# Patient Record
Sex: Male | Born: 1935 | Race: White | Hispanic: No | Smoking: Former smoker
Health system: Southern US, Community
[De-identification: ages and names within clinical notes are randomized; demographics above are authoritative.]

## PROBLEM LIST (undated history)

## (undated) DIAGNOSIS — M1990 Unspecified osteoarthritis, unspecified site: Secondary | ICD-10-CM

## (undated) DIAGNOSIS — G8929 Other chronic pain: Secondary | ICD-10-CM

## (undated) DIAGNOSIS — K219 Gastro-esophageal reflux disease without esophagitis: Secondary | ICD-10-CM

## (undated) DIAGNOSIS — I4891 Unspecified atrial fibrillation: Secondary | ICD-10-CM

## (undated) DIAGNOSIS — C801 Malignant (primary) neoplasm, unspecified: Secondary | ICD-10-CM

## (undated) DIAGNOSIS — I10 Essential (primary) hypertension: Secondary | ICD-10-CM

## (undated) DIAGNOSIS — E7800 Pure hypercholesterolemia, unspecified: Secondary | ICD-10-CM

## (undated) DIAGNOSIS — R200 Anesthesia of skin: Secondary | ICD-10-CM

## (undated) DIAGNOSIS — N189 Chronic kidney disease, unspecified: Secondary | ICD-10-CM

## (undated) DIAGNOSIS — Z974 Presence of external hearing-aid: Secondary | ICD-10-CM

## (undated) DIAGNOSIS — M353 Polymyalgia rheumatica: Secondary | ICD-10-CM

## (undated) DIAGNOSIS — M549 Dorsalgia, unspecified: Secondary | ICD-10-CM

## (undated) HISTORY — PX: HERNIA REPAIR: SHX51

---

## 1988-09-15 HISTORY — PX: GANGLION CYST EXCISION: SHX1691

## 1996-09-15 HISTORY — PX: CATARACT EXTRACTION: SUR2

## 2009-03-15 ENCOUNTER — Ambulatory Visit: Payer: Self-pay | Admitting: Family Medicine

## 2011-07-06 ENCOUNTER — Emergency Department: Payer: Self-pay | Admitting: *Deleted

## 2011-07-08 ENCOUNTER — Emergency Department: Payer: Self-pay | Admitting: Internal Medicine

## 2014-12-20 DIAGNOSIS — M150 Primary generalized (osteo)arthritis: Secondary | ICD-10-CM | POA: Diagnosis not present

## 2014-12-20 DIAGNOSIS — M353 Polymyalgia rheumatica: Secondary | ICD-10-CM | POA: Diagnosis not present

## 2014-12-20 DIAGNOSIS — E780 Pure hypercholesterolemia: Secondary | ICD-10-CM | POA: Diagnosis not present

## 2014-12-20 DIAGNOSIS — I10 Essential (primary) hypertension: Secondary | ICD-10-CM | POA: Diagnosis not present

## 2014-12-26 DIAGNOSIS — M150 Primary generalized (osteo)arthritis: Secondary | ICD-10-CM | POA: Diagnosis not present

## 2014-12-26 DIAGNOSIS — M5441 Lumbago with sciatica, right side: Secondary | ICD-10-CM | POA: Diagnosis not present

## 2014-12-26 DIAGNOSIS — E780 Pure hypercholesterolemia: Secondary | ICD-10-CM | POA: Diagnosis not present

## 2014-12-26 DIAGNOSIS — I10 Essential (primary) hypertension: Secondary | ICD-10-CM | POA: Diagnosis not present

## 2015-02-06 DIAGNOSIS — R079 Chest pain, unspecified: Secondary | ICD-10-CM | POA: Diagnosis not present

## 2015-02-06 DIAGNOSIS — I4891 Unspecified atrial fibrillation: Secondary | ICD-10-CM | POA: Diagnosis not present

## 2015-02-06 DIAGNOSIS — R0602 Shortness of breath: Secondary | ICD-10-CM | POA: Diagnosis not present

## 2015-02-07 DIAGNOSIS — I10 Essential (primary) hypertension: Secondary | ICD-10-CM | POA: Diagnosis not present

## 2015-02-07 DIAGNOSIS — E780 Pure hypercholesterolemia: Secondary | ICD-10-CM | POA: Diagnosis not present

## 2015-02-07 DIAGNOSIS — I4891 Unspecified atrial fibrillation: Secondary | ICD-10-CM | POA: Diagnosis not present

## 2015-02-07 DIAGNOSIS — M353 Polymyalgia rheumatica: Secondary | ICD-10-CM | POA: Diagnosis not present

## 2015-02-13 ENCOUNTER — Encounter: Payer: Self-pay | Admitting: *Deleted

## 2015-02-13 ENCOUNTER — Emergency Department: Payer: Commercial Managed Care - HMO

## 2015-02-13 ENCOUNTER — Inpatient Hospital Stay
Admission: EM | Admit: 2015-02-13 | Discharge: 2015-02-20 | DRG: 377 | Disposition: A | Discharge status: Home / Self Care | Payer: Commercial Managed Care - HMO | Attending: Internal Medicine | Admitting: Internal Medicine

## 2015-02-13 DIAGNOSIS — M353 Polymyalgia rheumatica: Secondary | ICD-10-CM | POA: Diagnosis present

## 2015-02-13 DIAGNOSIS — K7460 Unspecified cirrhosis of liver: Secondary | ICD-10-CM | POA: Diagnosis present

## 2015-02-13 DIAGNOSIS — M549 Dorsalgia, unspecified: Secondary | ICD-10-CM | POA: Diagnosis present

## 2015-02-13 DIAGNOSIS — R778 Other specified abnormalities of plasma proteins: Secondary | ICD-10-CM

## 2015-02-13 DIAGNOSIS — R0600 Dyspnea, unspecified: Secondary | ICD-10-CM | POA: Diagnosis present

## 2015-02-13 DIAGNOSIS — R7989 Other specified abnormal findings of blood chemistry: Secondary | ICD-10-CM

## 2015-02-13 DIAGNOSIS — I248 Other forms of acute ischemic heart disease: Secondary | ICD-10-CM | POA: Diagnosis not present

## 2015-02-13 DIAGNOSIS — N170 Acute kidney failure with tubular necrosis: Secondary | ICD-10-CM | POA: Diagnosis not present

## 2015-02-13 DIAGNOSIS — K2970 Gastritis, unspecified, without bleeding: Secondary | ICD-10-CM | POA: Diagnosis present

## 2015-02-13 DIAGNOSIS — E869 Volume depletion, unspecified: Secondary | ICD-10-CM | POA: Diagnosis present

## 2015-02-13 DIAGNOSIS — Z79899 Other long term (current) drug therapy: Secondary | ICD-10-CM | POA: Diagnosis not present

## 2015-02-13 DIAGNOSIS — R0602 Shortness of breath: Secondary | ICD-10-CM | POA: Diagnosis not present

## 2015-02-13 DIAGNOSIS — D500 Iron deficiency anemia secondary to blood loss (chronic): Secondary | ICD-10-CM | POA: Diagnosis not present

## 2015-02-13 DIAGNOSIS — I482 Chronic atrial fibrillation: Secondary | ICD-10-CM | POA: Diagnosis not present

## 2015-02-13 DIAGNOSIS — K922 Gastrointestinal hemorrhage, unspecified: Secondary | ICD-10-CM | POA: Diagnosis present

## 2015-02-13 DIAGNOSIS — I4891 Unspecified atrial fibrillation: Secondary | ICD-10-CM | POA: Diagnosis not present

## 2015-02-13 DIAGNOSIS — N183 Chronic kidney disease, stage 3 (moderate): Secondary | ICD-10-CM | POA: Diagnosis not present

## 2015-02-13 DIAGNOSIS — K766 Portal hypertension: Secondary | ICD-10-CM | POA: Diagnosis present

## 2015-02-13 DIAGNOSIS — D72829 Elevated white blood cell count, unspecified: Secondary | ICD-10-CM | POA: Diagnosis present

## 2015-02-13 DIAGNOSIS — K449 Diaphragmatic hernia without obstruction or gangrene: Secondary | ICD-10-CM | POA: Diagnosis not present

## 2015-02-13 DIAGNOSIS — Z87891 Personal history of nicotine dependence: Secondary | ICD-10-CM

## 2015-02-13 DIAGNOSIS — N179 Acute kidney failure, unspecified: Secondary | ICD-10-CM | POA: Diagnosis present

## 2015-02-13 DIAGNOSIS — Z7901 Long term (current) use of anticoagulants: Secondary | ICD-10-CM

## 2015-02-13 DIAGNOSIS — K222 Esophageal obstruction: Secondary | ICD-10-CM | POA: Diagnosis present

## 2015-02-13 DIAGNOSIS — N281 Cyst of kidney, acquired: Secondary | ICD-10-CM | POA: Diagnosis present

## 2015-02-13 DIAGNOSIS — K921 Melena: Principal | ICD-10-CM | POA: Diagnosis present

## 2015-02-13 DIAGNOSIS — D689 Coagulation defect, unspecified: Secondary | ICD-10-CM | POA: Diagnosis not present

## 2015-02-13 DIAGNOSIS — E785 Hyperlipidemia, unspecified: Secondary | ICD-10-CM | POA: Diagnosis present

## 2015-02-13 DIAGNOSIS — I129 Hypertensive chronic kidney disease with stage 1 through stage 4 chronic kidney disease, or unspecified chronic kidney disease: Secondary | ICD-10-CM | POA: Diagnosis present

## 2015-02-13 DIAGNOSIS — J449 Chronic obstructive pulmonary disease, unspecified: Secondary | ICD-10-CM | POA: Diagnosis not present

## 2015-02-13 DIAGNOSIS — R200 Anesthesia of skin: Secondary | ICD-10-CM | POA: Diagnosis present

## 2015-02-13 DIAGNOSIS — R740 Nonspecific elevation of levels of transaminase and lactic acid dehydrogenase [LDH]: Secondary | ICD-10-CM | POA: Diagnosis not present

## 2015-02-13 DIAGNOSIS — Z66 Do not resuscitate: Secondary | ICD-10-CM | POA: Diagnosis present

## 2015-02-13 DIAGNOSIS — K7200 Acute and subacute hepatic failure without coma: Secondary | ICD-10-CM | POA: Diagnosis not present

## 2015-02-13 DIAGNOSIS — D62 Acute posthemorrhagic anemia: Secondary | ICD-10-CM | POA: Diagnosis present

## 2015-02-13 DIAGNOSIS — R748 Abnormal levels of other serum enzymes: Secondary | ICD-10-CM

## 2015-02-13 DIAGNOSIS — I2510 Atherosclerotic heart disease of native coronary artery without angina pectoris: Secondary | ICD-10-CM | POA: Diagnosis not present

## 2015-02-13 DIAGNOSIS — M1990 Unspecified osteoarthritis, unspecified site: Secondary | ICD-10-CM | POA: Diagnosis present

## 2015-02-13 DIAGNOSIS — G8929 Other chronic pain: Secondary | ICD-10-CM | POA: Diagnosis present

## 2015-02-13 DIAGNOSIS — I480 Paroxysmal atrial fibrillation: Secondary | ICD-10-CM | POA: Diagnosis present

## 2015-02-13 DIAGNOSIS — R188 Other ascites: Secondary | ICD-10-CM | POA: Diagnosis not present

## 2015-02-13 DIAGNOSIS — D649 Anemia, unspecified: Secondary | ICD-10-CM | POA: Diagnosis not present

## 2015-02-13 DIAGNOSIS — R51 Headache: Secondary | ICD-10-CM | POA: Diagnosis not present

## 2015-02-13 DIAGNOSIS — R945 Abnormal results of liver function studies: Secondary | ICD-10-CM | POA: Diagnosis not present

## 2015-02-13 DIAGNOSIS — I10 Essential (primary) hypertension: Secondary | ICD-10-CM | POA: Diagnosis not present

## 2015-02-13 DIAGNOSIS — I350 Nonrheumatic aortic (valve) stenosis: Secondary | ICD-10-CM | POA: Diagnosis not present

## 2015-02-13 HISTORY — DX: Pure hypercholesterolemia, unspecified: E78.00

## 2015-02-13 HISTORY — DX: Essential (primary) hypertension: I10

## 2015-02-13 HISTORY — DX: Chronic kidney disease, unspecified: N18.9

## 2015-02-13 HISTORY — DX: Other chronic pain: G89.29

## 2015-02-13 HISTORY — DX: Dorsalgia, unspecified: M54.9

## 2015-02-13 HISTORY — DX: Unspecified atrial fibrillation: I48.91

## 2015-02-13 HISTORY — DX: Polymyalgia rheumatica: M35.3

## 2015-02-13 HISTORY — DX: Unspecified osteoarthritis, unspecified site: M19.90

## 2015-02-13 LAB — BASIC METABOLIC PANEL
Anion gap: 21 — ABNORMAL HIGH (ref 5–15)
BUN: 60 mg/dL — ABNORMAL HIGH (ref 6–20)
CALCIUM: 8.6 mg/dL — AB (ref 8.9–10.3)
CO2: 12 mmol/L — ABNORMAL LOW (ref 22–32)
Chloride: 102 mmol/L (ref 101–111)
Creatinine, Ser: 2.34 mg/dL — ABNORMAL HIGH (ref 0.61–1.24)
GFR calc Af Amer: 27 mL/min — ABNORMAL LOW (ref >60)
GFR calc non Af Amer: 23 mL/min — ABNORMAL LOW (ref >60)
GLUCOSE: 121 mg/dL — AB (ref 65–99)
POTASSIUM: 5.2 mmol/L — AB (ref 3.5–5.1)
Sodium: 135 mmol/L (ref 135–145)

## 2015-02-13 LAB — CBC
HCT: 20.9 % — ABNORMAL LOW (ref 40.0–52.0)
Hemoglobin: 6.6 g/dL — ABNORMAL LOW (ref 13.0–18.0)
MCH: 32 pg (ref 26.0–34.0)
MCHC: 31.6 g/dL — ABNORMAL LOW (ref 32.0–36.0)
MCV: 101 fL — ABNORMAL HIGH (ref 80.0–100.0)
PLATELETS: 269 10*3/uL (ref 150–440)
RBC: 2.07 MIL/uL — ABNORMAL LOW (ref 4.40–5.90)
RDW: 14.7 % — ABNORMAL HIGH (ref 11.5–14.5)
WBC: 20.5 10*3/uL — AB (ref 3.8–10.6)

## 2015-02-13 LAB — BRAIN NATRIURETIC PEPTIDE: B Natriuretic Peptide: 839 pg/mL — ABNORMAL HIGH (ref 0.0–100.0)

## 2015-02-13 LAB — TROPONIN I
TROPONIN I: 0.4 ng/mL — AB (ref <0.031)
TROPONIN I: 0.32 ng/mL — AB (ref <0.031)

## 2015-02-13 LAB — ABO/RH: ABO/RH(D): O POS

## 2015-02-13 LAB — PREPARE RBC (CROSSMATCH)

## 2015-02-13 MED ORDER — SODIUM CHLORIDE 0.9 % IV SOLN
INTRAVENOUS | Status: AC
  Administered 2015-02-13 – 2015-02-14 (×2): via INTRAVENOUS

## 2015-02-13 MED ORDER — SODIUM CHLORIDE 0.9 % IV SOLN
10.0000 mL/h | Freq: Once | INTRAVENOUS | Status: AC
  Administered 2015-02-13: 10 mL/h via INTRAVENOUS

## 2015-02-13 MED ORDER — ACETAMINOPHEN 325 MG PO TABS: 650.0000 mg | ORAL_TABLET | Freq: Four times a day (QID) | ORAL | Status: DC | PRN

## 2015-02-13 MED ORDER — ONDANSETRON HCL 4 MG PO TABS: 4.0000 mg | ORAL_TABLET | Freq: Four times a day (QID) | ORAL | Status: DC | PRN

## 2015-02-13 MED ORDER — SODIUM CHLORIDE 0.9 % IV SOLN
80.0000 mg | Freq: Once | INTRAVENOUS | Status: AC
  Administered 2015-02-14: 07:00:00 80 mg via INTRAVENOUS
  Filled 2015-02-13 (×2): qty 80

## 2015-02-13 MED ORDER — SODIUM CHLORIDE 0.9 % IJ SOLN
3.0000 mL | Freq: Two times a day (BID) | INTRAMUSCULAR | Status: DC
  Administered 2015-02-13 – 2015-02-20 (×13): 3 mL via INTRAVENOUS

## 2015-02-13 MED ORDER — METOPROLOL TARTRATE 25 MG PO TABS
25.0000 mg | ORAL_TABLET | Freq: Two times a day (BID) | ORAL | Status: DC
  Administered 2015-02-13 – 2015-02-20 (×13): 25 mg via ORAL
  Filled 2015-02-13 (×14): qty 1

## 2015-02-13 MED ORDER — ALBUTEROL SULFATE (2.5 MG/3ML) 0.083% IN NEBU: 2.5000 mg | INHALATION_SOLUTION | Freq: Four times a day (QID) | RESPIRATORY_TRACT | Status: DC | PRN

## 2015-02-13 MED ORDER — PRAVASTATIN SODIUM 20 MG PO TABS
20.0000 mg | ORAL_TABLET | Freq: Every day | ORAL | Status: DC
  Administered 2015-02-14 – 2015-02-15 (×2): 20 mg via ORAL
  Filled 2015-02-13 (×2): qty 1

## 2015-02-13 MED ORDER — DILTIAZEM HCL ER COATED BEADS 240 MG PO CP24
240.0000 mg | ORAL_CAPSULE | Freq: Every day | ORAL | Status: DC
  Administered 2015-02-14 – 2015-02-20 (×7): 240 mg via ORAL
  Filled 2015-02-13 (×8): qty 1

## 2015-02-13 MED ORDER — SODIUM CHLORIDE 0.9 % IV SOLN
8.0000 mg/h | INTRAVENOUS | Status: AC
  Administered 2015-02-14 – 2015-02-16 (×5): 8 mg/h via INTRAVENOUS
  Filled 2015-02-13 (×5): qty 80

## 2015-02-13 MED ORDER — ACETAMINOPHEN 650 MG RE SUPP: 650.0000 mg | Freq: Four times a day (QID) | RECTAL | Status: DC | PRN

## 2015-02-13 MED ORDER — ONDANSETRON HCL 4 MG/2ML IJ SOLN: 4.0000 mg | Freq: Four times a day (QID) | INTRAMUSCULAR | Status: DC | PRN

## 2015-02-13 NOTE — ED Notes (Signed)
Lab called at [facility] to notify rn that the blood is ready, receiving rn on the floor made aware of the same

## 2015-02-13 NOTE — ED Notes (Signed)
Pt arrives via EMS. Pt reports increasing shortness of breath, especially with ambulation for several weeks. Denies fever or cough.

## 2015-02-13 NOTE — H&P (Signed)
William Tapia at [place] NAME: William Tapia    MR#:  [id number]  DATE OF BIRTH:  09-16-1924  DATE OF ADMISSION:  02/13/2015  PRIMARY CARE PHYSICIAN: Tracie Harrier, MD   REQUESTING/REFERRING PHYSICIAN: Dr. Thomasene Lot  CHIEF COMPLAINT:   Chief Complaint  Patient presents with  . Shortness of Breath    HISTORY OF PRESENT ILLNESS:  HPI William Tapia is a 81 y.o. male with Afib recently started on Xarelto, HTN, Chronic back pain who is been having worsening shortness of breath over the past 6 days. He reports this shortness of breath is worse when he is up and walking. He is not able to walk nearly the distance that he was previously. He denies any shortness of breath while sleeping or at night (no orthopnea). He has seen his primary physician, Dr. Ginette Pitman day recently as well as Dr. Ubaldo Glassing. Dr. Ubaldo Glassing has ordered an echocardiogram on him for 8 days from now.  In addition to the dyspnea on exertion, the patient reports he is having an odd feeling of numbness in the tip of his tongue.  Patient denies any chest pain. He does report he has a mild posterior headache at the moment.  Started on Xarelto 6 days back and he started noticing black stool immediately the next day. NO frank blood.  PAST MEDICAL HISTORY:   Past Medical History  Diagnosis Date  . A-fib   . High cholesterol   . PMR (polymyalgia rheumatica)   . Chronic back pain   . Osteoarthritis   . Hypertension     PAST SURGICAL HISTORY:   Past Surgical History  Procedure Laterality Date  . Hernia repair      SOCIAL HISTORY:   History  Substance Use Topics  . Smoking status: Former Smoker    Types: Cigarettes  . Smokeless tobacco: Not on file  . Alcohol Use: No     Comment: 3 beers a day for many years and quit 02/06/2015    FAMILY HISTORY:   Family History  Problem Relation Age of Onset  . Family history unknown: Yes    DRUG ALLERGIES:  No Known  Allergies  REVIEW OF SYSTEMS:   Review of Systems  Constitutional: Positive for malaise/fatigue. Negative for fever, chills and weight loss.  HENT: Negative for hearing loss and nosebleeds.   Eyes: Negative for blurred vision, double vision and pain.  Respiratory: Positive for shortness of breath. Negative for cough, hemoptysis, sputum production and wheezing.   Cardiovascular: Negative for chest pain, palpitations, orthopnea and leg swelling.  Gastrointestinal: Positive for melena. Negative for nausea, vomiting, abdominal pain, diarrhea and constipation.  Genitourinary: Negative for dysuria and hematuria.  Musculoskeletal: Negative for myalgias, back pain and falls.  Skin: Negative for rash.  Neurological: Negative for dizziness, tremors, sensory change, speech change, focal weakness, seizures and headaches.  Endo/Heme/Allergies: Does not bruise/bleed easily.  Psychiatric/Behavioral: Negative for depression and memory loss. The patient is not nervous/anxious.     MEDICATIONS AT HOME:   Prior to Admission medications   Medication Sig Start Date End Date Taking? Authorizing Provider  CARTIA XT 240 MG 24 hr capsule Take 1 capsule by mouth daily. 11/29/14  Yes Historical Provider, MD  Coenzyme Q10 (COQ-10 PO) Take 1 tablet by mouth daily.   Yes Historical Provider, MD  lovastatin (MEVACOR) 20 MG tablet Take 20 mg by mouth daily.   Yes Historical Provider, MD  metoprolol tartrate (LOPRESSOR) 25 MG tablet Take 25 mg  by mouth 2 (two) times daily.   Yes Historical Provider, MD  rivaroxaban (XARELTO) 20 MG TABS tablet Take 20 mg by mouth daily.   Yes Historical Provider, MD  VENTOLIN HFA 108 (90 BASE) MCG/ACT inhaler Take 2 puffs by mouth every 6 (six) hours as needed. For wheezing 12/26/14  Yes Historical Provider, MD      VITAL SIGNS:  Blood pressure 120/59, pulse 60, temperature 97.2 F (36.2 C), resp. rate 17, height 5\' 6"  (1.676 m), weight 63.957 kg (141 lb), SpO2 100 %.  PHYSICAL  EXAMINATION:  Physical Exam  GENERAL:  80 y.o.-year-old patient lying in the bed with no acute distress.  EYES: Pupils equal, round, reactive to light and accommodation. No scleral icterus. Extraocular muscles intact.  HEENT: Head atraumatic, normocephalic. Oropharynx and nasopharynx clear. No oropharyngeal erythema, moist oral mucosa  NECK:  Supple, no jugular venous distention. No thyroid enlargement, no tenderness.  LUNGS: Normal breath sounds bilaterally, no wheezing, rales, rhonchi. No use of accessory muscles of respiration.  CARDIOVASCULAR: S1, S2 normal. No murmurs, rubs, or gallops. Irregular. ABDOMEN: Soft, nontender, nondistended. Bowel sounds present. No organomegaly or mass.  EXTREMITIES: No pedal edema, cyanosis, or clubbing. + 2 pedal & radial pulses b/l.   NEUROLOGIC: Cranial nerves II through XII are intact. No focal Motor or sensory deficits appreciated b/l PSYCHIATRIC: The patient is alert and oriented x 3. Good affect.  SKIN: No obvious rash, lesion, or ulcer.   LABORATORY PANEL:   CBC  Recent Labs Lab 02/13/15 1617  WBC 20.5*  HGB 6.6*  HCT 20.9*  PLT 269   ------------------------------------------------------------------------------------------------------------------  Chemistries   Recent Labs Lab 02/13/15 1617  NA 135  K 5.2*  CL 102  CO2 12*  GLUCOSE 121*  BUN 60*  CREATININE 2.34*  CALCIUM 8.6*   ------------------------------------------------------------------------------------------------------------------  Cardiac Enzymes  Recent Labs Lab 02/13/15 1617  TROPONINI 0.40*   ------------------------------------------------------------------------------------------------------------------  RADIOLOGY:  Dg Chest 2 View  02/13/2015   CLINICAL DATA:  Increasing shortness of Breath  EXAM: CHEST  2 VIEW  COMPARISON:  None.  FINDINGS: Cardiac shadow is enlarged. The lungs are hyperinflated bilaterally consistent with COPD. No focal infiltrate  or sizable effusion is noted. Degenerative change of the thoracic spine is seen. Mild aortic calcifications are noted.  IMPRESSION: COPD without acute abnormality.   Electronically Signed   By: Inez Catalina M.D.   On: 02/13/2015 16:57   Ct Head Wo Contrast  02/13/2015   CLINICAL DATA:  Headaches  EXAM: CT HEAD WITHOUT CONTRAST  TECHNIQUE: Contiguous axial images were obtained from the base of the skull through the vertex without intravenous contrast.  COMPARISON:  None.  FINDINGS: Bony calvarium is intact. Mild atrophic changes are noted. No findings to suggest acute hemorrhage, acute infarction or space-occupying mass lesion are noted.  IMPRESSION: Atrophic changes without acute abnormality.   Electronically Signed   By: Inez Catalina M.D.   On: 02/13/2015 17:10     IMPRESSION AND PLAN:   * GI bleed- Melena Prolonged history of daily alcohol intake. Possibly has gastritis or undiagnosed peptic ulcer disease. Xarelto could have initiated the bleeding. Hold Xarelto. Serial hemoglobins. Stat transfusion of 2 units of packed RBC. Protonix drip. Consult GI.  * Acute blood loss anemia Due to GI bleed.  * ARF likely from GI bleed/hypotension Start IV fluids. Replace volume depletion with blood and fluids. Monitor input and output. Repeat BUN and creatinine in the morning.  * Elevated troponin 0.4. Likely from demand ischemia  of severe anemia and atrial fibrillation in setting of worsening kidney function. No chest pain. Repeat troponin. Not NSTEMI. We will consult cardiology if there is significant increase in troponin. No aspirin due to GI bleed.  * Afib Rate controlled. Hold Xarelto.  * Hypertension   All the records are reviewed and case discussed with ED provider. Management plans discussed with the patient, family and they are in agreement.  CODE STATUS: DNR/DNI  TOTAL TIME TAKING CARE OF THIS PATIENT: 40 minutes.    Hillary Bow R M.D on 02/13/2015 at 6:52 PM  Between 7am to 6pm  - Pager - [phone]  After 6pm go to [email or web address] - password [email or web address] [facility]  Office  [phone]  CC: Primary care physician; Tracie Harrier, MD

## 2015-02-13 NOTE — ED Provider Notes (Signed)
[facility] Emergency Department Provider Note  ____________________________________________  Time seen: 1558  I have reviewed the triage vital signs and the nursing notes.   HISTORY  Chief Complaint Shortness of Breath  dyspnea on exertion     HPI William Tapia is a 81 y.o. male who is been having worsening shortness of breath over the past 2 weeks. He reports this shortness of breath is worse when he is up and walking. He is not able to walk nearly the distance that he was previously. He denies any shortness of breath while sleeping or at night (no orthopnea). He has seen his primary physician, Dr. Ginette Pitman day recently as well as Dr. Ubaldo Glassing. Dr. Ubaldo Glassing has ordered an echocardiogram on him for 8 days from now.  In addition to the dyspnea on exertion, the patient reports he is having an odd feeling of numbness in the tip of his tongue. This is also leading to some impaired speech. His wife, who is in the room with [place], reports that the mild lisp he has is new. The patient also reports he is having some difficulty swallowing.  Patient denies any chest pain. He does report he has a mild posterior headache at the moment.   Past Medical History  Diagnosis Date  . A-fib   . High cholesterol   . PMR (polymyalgia rheumatica)     There are no active problems to display for this patient.   Past Surgical History  Procedure Laterality Date  . Hernia repair      Current Outpatient Rx  Name  Route  Sig  Dispense  Refill  . CARTIA XT 240 MG 24 hr capsule   Oral   Take 1 capsule by mouth daily.           Dispense as written.   . Coenzyme Q10 (COQ-10 PO)   Oral   Take 1 tablet by mouth daily.         Marland Kitchen lovastatin (MEVACOR) 20 MG tablet   Oral   Take 20 mg by mouth daily.         . metoprolol tartrate (LOPRESSOR) 25 MG tablet   Oral   Take 25 mg by mouth 2 (two) times daily.         . rivaroxaban (XARELTO) 20 MG TABS tablet   Oral   Take 20 mg by  mouth daily.         . VENTOLIN HFA 108 (90 BASE) MCG/ACT inhaler   Oral   Take 2 puffs by mouth every 6 (six) hours as needed. For wheezing      5     Dispense as written.     Allergies Review of patient's allergies indicates no known allergies.  No family history on file.  Social History History  Substance Use Topics  . Smoking status: Never Smoker   . Smokeless tobacco: Not on file  . Alcohol Use: No    Review of Systems  Constitutional: Negative for fever. ENT: Negative for sore throat. Cardiovascular: Negative for chest pain. History of atrial fibrillation Respiratory: Positive for shortness of breath. Gastrointestinal: Negative for abdominal pain, vomiting and diarrhea. Genitourinary: Negative for dysuria. Musculoskeletal: Negative for back pain. Skin: Negative for rash. Neurological: Positive for headache as well as for the past seizure was mild speech impediment. See history of present illness   10-point ROS otherwise negative.  ____________________________________________   PHYSICAL EXAM:  VITAL SIGNS: ED Triage Vitals  Enc Vitals Group  BP --      Pulse --      Resp --      Temp --      Temp src --      SpO2 --      Weight --      Height --      Head Cir --      Peak Flow --      Pain Score --      Pain Loc --      Pain Edu? --      Excl. in [place]? --     Constitutional:  Alert and oriented. Mildly tachypnea, mild increased work of breathing, but no acute distress. ENT   Head: Normocephalic and atraumatic.   Nose: No congestion/rhinnorhea.   Mouth/Throat: Mucous membranes are moist. Cardiovascular: Rate of 60 with atrial fibrillation, irregularly irregular. Respiratory: Mild tachypnea, mild increase work of breathing, otherwise no acute distress. No wheezing or rales.. Gastrointestinal: Soft and nontender. No distention.  Back: No muscle spasm, no tenderness, no CVA tenderness. Musculoskeletal: Nontender with normal range  of motion in all extremities.  No noted edema. Neurologic: Alert and oriented. Moves all 4 extremities without difficulty. Equal grip strength bilaterally. Cranial nerves II through XII are intact. The tongue appears to be moving fairly well except for the noted lisp. Skin:  Skin is warm, dry. No rash noted. Psychiatric: Mood and affect are normal. Speech and behavior are normal.   ----------------------------------------- 5:38 PM on 02/13/2015 -----------------------------------------  Rectal exam: Nontender, dark stool, heme positive. ____________________________________________    LABS (pertinent positives/negatives)  White blood cell count of 20,000, hemoglobin of 6.6. Potassium of 5.2. BUN of 60 creatinine of 2.34. Troponin elevated at 0.40 BNP of 839.  ____________________________________________   EKG  ED ECG REPORT I, Sheppard Luckenbach W, the attending physician, personally viewed and interpreted this ECG.   Date: 02/13/2015  EKG Time: 1608  Rate: 64  Rhythm: Atrial fibrillation Normal sinus rhythm  Axis: 27,  Normal  Intervals: QTc 524  ST&T Change: Flat T in aVL and flipped T in V6   ____________________________________________    RADIOLOGY  Chest x-ray  IMPRESSION: COPD without acute abnormality.  CT head IMPRESSION: Atrophic changes without acute abnormality.  ____________________________________________   PROCEDURES  Critical Care performed:  CRITICAL CARE Performed by: Ahmed Prima   Total critical care time: 40 minutes due to the critical condition of this patient with leukocytosis, anemia, and acute renal failure. This included orders for resuscitation as well as discussion with the hospitalist for admission and with the family.  Critical care time was exclusive of separately billable procedures and treating other patients.  Critical care was necessary to treat or prevent imminent or life-threatening deterioration.  Critical care  was time spent personally by me on the following activities: development of treatment plan with patient and/or surrogate as well as nursing, discussions with consultants, evaluation of patient's response to treatment, examination of patient, obtaining history from patient or surrogate, ordering and performing treatments and interventions, ordering and review of laboratory studies, ordering and review of radiographic studies, pulse oximetry and re-evaluation of patient's condition.   ____________________________________________   INITIAL IMPRESSION / ASSESSMENT AND PLAN / ED COURSE  Patient without a history of coronary disease or pulmonary disease with increased shortness of breath, dyspnea on exertion. Also concern is this paresthesia in his tongue and some difficulty swallowing and this mild speech impediment which is new. Given the fact that he has the symptoms in context  of a posterior headache I will obtain a head CT on this 81 year old gentleman. We will continue with a cardiac and respiratory evaluation.   ----------------------------------------- 5:39 PM on 02/13/2015 -----------------------------------------  Patient with anemia. He has heme positive stool on rectal exam. The patient now tells me that he has had symptoms since beginning Xarelto recently. His BUN is elevated consistent with a GI bleed. Of note, when performing the rectal exam and having the patient move in bed and roll over he became slightly short of breath.  We will speak with the admitting team for admission and ongoing care in the hospital. ____________________________________________   FINAL CLINICAL IMPRESSION(S) / ED DIAGNOSES  Final diagnoses:  Anemia, unspecified anemia type  Acute renal failure, unspecified acute renal failure type  Elevated troponin level  Dyspnea  Gastrointestinal hemorrhage, unspecified gastritis, unspecified gastrointestinal hemorrhage type      Ahmed Prima, MD 02/13/15  [phone]

## 2015-02-14 ENCOUNTER — Encounter: Payer: Self-pay | Admitting: Urgent Care

## 2015-02-14 DIAGNOSIS — K7200 Acute and subacute hepatic failure without coma: Secondary | ICD-10-CM

## 2015-02-14 DIAGNOSIS — K921 Melena: Secondary | ICD-10-CM

## 2015-02-14 DIAGNOSIS — D649 Anemia, unspecified: Secondary | ICD-10-CM

## 2015-02-14 DIAGNOSIS — K922 Gastrointestinal hemorrhage, unspecified: Secondary | ICD-10-CM

## 2015-02-14 LAB — CBC
HEMATOCRIT: 24.9 % — AB (ref 40.0–52.0)
HEMOGLOBIN: 8.1 g/dL — AB (ref 13.0–18.0)
MCH: 31 pg (ref 26.0–34.0)
MCHC: 32.3 g/dL (ref 32.0–36.0)
MCV: 95.8 fL (ref 80.0–100.0)
Platelets: 215 10*3/uL (ref 150–440)
RBC: 2.6 MIL/uL — AB (ref 4.40–5.90)
RDW: 14.9 % — ABNORMAL HIGH (ref 11.5–14.5)
WBC: 20.6 10*3/uL — ABNORMAL HIGH (ref 3.8–10.6)

## 2015-02-14 LAB — BASIC METABOLIC PANEL
Anion gap: 11 (ref 5–15)
BUN: 76 mg/dL — AB (ref 6–20)
CHLORIDE: 104 mmol/L (ref 101–111)
CO2: 18 mmol/L — ABNORMAL LOW (ref 22–32)
CREATININE: 2.64 mg/dL — AB (ref 0.61–1.24)
Calcium: 8 mg/dL — ABNORMAL LOW (ref 8.9–10.3)
GFR calc Af Amer: 23 mL/min — ABNORMAL LOW (ref >60)
GFR calc non Af Amer: 20 mL/min — ABNORMAL LOW (ref >60)
GLUCOSE: 110 mg/dL — AB (ref 65–99)
Potassium: 4.8 mmol/L (ref 3.5–5.1)
Sodium: 133 mmol/L — ABNORMAL LOW (ref 135–145)

## 2015-02-14 LAB — TROPONIN I
TROPONIN I: 0.39 ng/mL — AB (ref <0.031)
Troponin I: 0.42 ng/mL — ABNORMAL HIGH (ref <0.031)

## 2015-02-14 MED ORDER — SODIUM CHLORIDE 0.9 % IV SOLN
INTRAVENOUS | Status: AC
Start: 2015-02-14 — End: 2015-02-15
  Administered 2015-02-14 – 2015-02-15 (×2): via INTRAVENOUS

## 2015-02-14 NOTE — Progress Notes (Signed)
Initial Nutrition Assessment  DOCUMENTATION CODES:     INTERVENTION: Medical Food Supplement Therapy: will recommend Vanilla Ensure Enlive po BID, each supplement provides 350 kcal and 20 grams of protein once diet able to be advanced    NUTRITION DIAGNOSIS:  Inadequate oral intake related to inability to eat as evidenced by NPO status.  GOAL:   (Goal for diet advancement as medically able within 5-7 days)  MONITOR:   (Energy Intake, Digestive system, Anemia Profile, Anthropometrics, Electrolyte and renal Profile)  REASON FOR ASSESSMENT:  Malnutrition Screening Tool    ASSESSMENT:   Present on Admission:  . GI bleed GI consult pending. Pt s/p blood transfusion.  Pt also noted to have acute renal failure per MD note.  Past Medical History  Diagnosis Date  . A-fib   . High cholesterol   . PMR (polymyalgia rheumatica)   . Chronic back pain   . Osteoarthritis   . Hypertension    PO Intake: pt remains NPO since admission. Pt reports day before admission drinking 3 Ensures secondary to poor appetite. Pt reports appetite reduced PTA. Pt reports usually eating 3 meals per day.  Medications: NS at 2mL/hr, Protonix Labs: Electrolyte and Renal Profile:  Recent Labs Lab 02/13/15 1617 02/14/15 0735  BUN 60* 76*  CREATININE 2.34* 2.64*  NA 135 133*  K 5.2* 4.8   Glucose Profile: No results for input(s): GLUCAP in the last 72 hours. Protein Profile: No results for input(s): ALBUMIN in the last 168 hours. Nutritional Anemia Profile:  CBC Latest Ref Rng 02/14/2015 02/13/2015  WBC 3.8 - 10.6 K/uL 20.6(H) 20.5(H)  Hemoglobin 13.0 - 18.0 g/dL 8.1(L) 6.6(L)  Hematocrit 40.0 - 52.0 % 24.9(L) 20.9(L)  Platelets 150 - 440 K/uL 215 269   Pt reports weight of 141lbs outpatient MD office on 5/24, usual body weight of 145lbs. (3% weight loss reported from [facility]) Pt weight currently recorded as 150lbs this am.  Height:  Ht Readings from Last 1 Encounters:  02/13/15 5\' 6"  (1.676  m)    Weight:  Wt Readings from Last 1 Encounters:  02/14/15 150 lb 3.2 oz (68.13 kg)    Ideal Body Weight:     Wt Readings from Last 10 Encounters:  02/14/15 150 lb 3.2 oz (68.13 kg)    BMI:  Body mass index is 24.25 kg/(m^2).   Nutrition-Focused physical exam completed. Findings are WDL for fat depletion, muscle depletion, and edema.    Estimated Nutritional Needs:  Kcal:  1709-2109kcals, BEE: 1294kcals, TEE: (IF 1.1-1.3)(AF 1.2)   Protein:  68-82g protein (1.0-1.2g/kg)  Fluid:  1705-2037mL of fluid (25-68mL/kg)  Skin:  Reviewed, no issues  Diet Order:  Diet NPO time specified  EDUCATION NEEDS:  No education needs identified at this time   Intake/Output Summary (Last 24 hours) at 02/14/15 1206 Last data filed at 02/14/15 0641  Gross per 24 hour  Intake    636 ml  Output    200 ml  Net    436 ml    Last BM:  5/31  MODERATE Care Level  Dwyane Luo, RD, LDN Pager [phone]

## 2015-02-14 NOTE — Plan of Care (Signed)
Problem: Discharge Progression Outcomes Goal: Discharge plan in place and appropriate Individualization Outcome: Progressing Individualization of Care 1. Pt likes to be called William Tapia 2. Pt lives at home with his wife 3. Pt has a hx of afib, HTN, chronic back pain, PMR, osteoarthritis, and high cholesterol controlled by home meds.  Goal: Other Discharge Outcomes/Goals Outcome: Progressing Plan of care progress to goal for: Pain-no c/o pain this shift Hemodynamically-VSS, pt received two units pRBC with no reaction Complications-pt gets SOB on excertion Diet-pt is NPO Activity-Pt has good mobility in bed

## 2015-02-14 NOTE — Progress Notes (Signed)
Spoke with Dr Lavetta Nielsen regarding metoprolol administration. Pt's HR 60-62, HR was once 116 during day shift, but sinus bradycardia previously. BP 118/51. Do you want to hold metoprolol tonight? MD verbalized to hold metorolol tonight.

## 2015-02-14 NOTE — Progress Notes (Signed)
Notified Dr. Ola Spurr that pt IVF order has been stopped per order. BP/HR stable. Pt is on clear liquid and NPO after midnight. Do you want to continue IVF? MD verbalized to continue IVF.

## 2015-02-14 NOTE — Progress Notes (Signed)
PROGRESS NOTE  William Tapia GYI:[id number] DOB: 03-18-1925 DOA: 02/13/2015 PCP: William Harrier, MD  HPI/Recap of past 24 hours: 81  Y/o male with hx of HTN, Recent onset A.Fib, Chronic back pain admitted with exertional shortness of breath, Melanotic stool . Hgb was 6.6 on admission Had been recently started on Xarelto for A-Fib Pt received 2 units of PRBC over night. This am feels better   Consultants: GI   Objective: BP 109/55 mmHg  Pulse 52  Temp(Src) 97.8 F (36.6 C) (Oral)  Resp 18  Ht 5\' 6"  (1.676 m)  Wt 68.13 kg (150 lb 3.2 oz)  BMI 24.25 kg/m2  SpO2 95%  Intake/Output Summary (Last 24 hours) at 02/14/15 0812 Last data filed at 02/14/15 0641  Gross per 24 hour  Intake    636 ml  Output    200 ml  Net    436 ml   Filed Weights   02/13/15 1556 02/13/15 1950 02/14/15 0500  Weight: 63.957 kg (141 lb) 59.693 kg (131 lb 9.6 oz) 68.13 kg (150 lb 3.2 oz)    Exam:   General:  Not in sdistress  Cardiovascular: S1 S2  Respiratory: Clear to auscultation  Abdomen: Soft. Non tender  Neuro:Non Focal  Data Reviewed: Basic Metabolic Panel:  Recent Labs Lab 02/13/15 1617  NA 135  K 5.2*  CL 102  CO2 12*  GLUCOSE 121*  BUN 60*  CREATININE 2.34*  CALCIUM 8.6*   Liver Function Tests: No results for input(s): AST, ALT, ALKPHOS, BILITOT, PROT, ALBUMIN in the last 168 hours. No results for input(s): LIPASE, AMYLASE in the last 168 hours. No results for input(s): AMMONIA in the last 168 hours. CBC:  Recent Labs Lab 02/13/15 1617 02/14/15 0735  WBC 20.5* 20.6*  HGB 6.6* 8.1*  HCT 20.9* 24.9*  MCV 101.0* 95.8  PLT 269 215   Cardiac Enzymes:    Recent Labs Lab 02/13/15 1617 02/13/15 1937 02/14/15 0107  TROPONINI 0.40* 0.32* 0.42*   BNP (last 3 results)  Recent Labs  02/13/15 1617  BNP 839.0*    ProBNP (last 3 results) No results for input(s): PROBNP in the last 8760 hours.  CBG: No results for input(s): GLUCAP in the last 168  hours.  No results found for this or any previous visit (from the past 240 hour(s)).   Studies: Dg Chest 2 View  02/13/2015   CLINICAL DATA:  Increasing shortness of Breath  EXAM: CHEST  2 VIEW  COMPARISON:  None.  FINDINGS: Cardiac shadow is enlarged. The lungs are hyperinflated bilaterally consistent with COPD. No focal infiltrate or sizable effusion is noted. Degenerative change of the thoracic spine is seen. Mild aortic calcifications are noted.  IMPRESSION: COPD without acute abnormality.   Electronically Signed   By: William Tapia M.D.   On: 02/13/2015 16:57   Ct Head Wo Contrast  02/13/2015   CLINICAL DATA:  Headaches  EXAM: CT HEAD WITHOUT CONTRAST  TECHNIQUE: Contiguous axial images were obtained from the base of the skull through the vertex without intravenous contrast.  COMPARISON:  None.  FINDINGS: Bony calvarium is intact. Mild atrophic changes are noted. No findings to suggest acute hemorrhage, acute infarction or space-occupying mass lesion are noted.  IMPRESSION: Atrophic changes without acute abnormality.   Electronically Signed   By: William Tapia M.D.   On: 02/13/2015 17:10    Scheduled Meds: . diltiazem  240 mg Oral Daily  . metoprolol tartrate  25 mg Oral BID  . pravastatin  20 mg Oral q1800  . sodium chloride  3 mL Intravenous Q12H    Continuous Infusions: . sodium chloride 75 mL/hr at 02/13/15 2049  . pantoprozole (PROTONIX) infusion 8 mg/hr (02/14/15 0736)    Assessment/Plan: 1 Acute GI bleed with blood loss anemia; Most likely  Upper GI source; Hgb improved to 8.1 following transfusion. Continue IV Protonix D/c Xarelto Consult GI 2 Recent onset - A- Fib-On Cardizem and metoprolol 3 Acute Renal Failure; Continue IV hydration. 4 Elevated Troponin; Probable demand ischemia. 5 Hyperlipidemia; On Pravachol 6 Physical Therapy   Code Status: DNR/DNI        William Tapia   02/14/2015, 8:12 AM  LOS: 1 day

## 2015-02-14 NOTE — Care Management (Signed)
Admitted to  [facility] with the diagnosis of GI Bleed. Lives with wife, Otilio Saber, [phone]). Sees Dr Ginette Pitman. Last seen in April. No home health. No skilled facility. States he has a stick in the home that he uses some time to steady himself when walking. No home oxygen. Takes care of all basic activities of daily living himself. Hgb 6.6. One unit of blood. NPO. GI consult. Family at the bedside. Shelbie Ammons RN MSN Care Management [phone]

## 2015-02-14 NOTE — Plan of Care (Signed)
Problem: Discharge Progression Outcomes Goal: Other Discharge Outcomes/Goals Plan of care progress to goal: Pain: denies pain VSS Diet: Clear liquids, tolerating well EGD tomorrow

## 2015-02-14 NOTE — Consult Note (Signed)
Gastroenterology Consultation  Referring Provider:    Dr. Ginette Pitman  Primary Care Physician:  Tracie Harrier, MD Primary Gastroenterologist:  n/a     Cardiologist:  Dr Ubaldo Glassing  Reason for Consultation:  Melena  Date of Admission:  02/13/2015 Date of Consultation:  02/14/2015         HPI:   William Tapia is a 81 y.o. male admitted with worsening shortness of breath over the past 6 days. He was recently started on Xarelto for atrial fibrillation 7 days ago and he started noticing melena the following day.  He took 1 more dose.  Denies heartburn, indigestion, nausea, vomiting, dysphagia, odynophagia.  Denies constipation or diarrhea.  Appetite has been poor.  He reports weight loss of a few pounds.  Last colonoscopy was almost 35 years ago per his report.  He used to drink 2-4 beers a night for many years, but he quit 5/24.    Past Medical History  Diagnosis Date  . A-fib   . High cholesterol   . PMR (polymyalgia rheumatica)   . Chronic back pain   . Osteoarthritis   . Hypertension     Past Surgical History  Procedure Laterality Date  . Hernia repair Right   . Ganglion cyst excision      foot  . Cataract extraction      Prior to Admission medications   Medication Sig Start Date End Date Taking? Authorizing Provider  CARTIA XT 240 MG 24 hr capsule Take 1 capsule by mouth daily. 11/29/14  Yes Historical Provider, MD  Coenzyme Q10 (COQ-10 PO) Take 1 tablet by mouth daily.   Yes Historical Provider, MD  cyanocobalamin 1000 MCG tablet Take 1,000 mcg by mouth daily.   Yes Historical Provider, MD  lovastatin (MEVACOR) 20 MG tablet Take 20 mg by mouth daily.   Yes Historical Provider, MD  metoprolol tartrate (LOPRESSOR) 25 MG tablet Take 25 mg by mouth 2 (two) times daily.   Yes Historical Provider, MD  rivaroxaban (XARELTO) 20 MG TABS tablet Take 20 mg by mouth daily.   Yes Historical Provider, MD  VENTOLIN HFA 108 (90 BASE) MCG/ACT inhaler Take 2 puffs by mouth every 6 (six) hours as  needed. For wheezing 12/26/14  Yes Historical Provider, MD    Family History  Problem Relation Age of Onset  . Family history unknown: Yes   There is no known family history of colorectal carcinoma , liver disease, or inflammatory bowel disease.  History  Substance Use Topics  . Smoking status: Former Smoker -- 35 years    Types: Cigarettes    Quit date: 02/13/2010  . Smokeless tobacco: Not on file  . 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 One healthy son.  Allergies as of 02/13/2015  . (No Known Allergies)    Review of Systems:    All systems reviewed and negative except where noted in HPI.   Physical Exam:  Vital signs in last 24 hours: Temp:  [97.2 F (36.2 C)-98.5 F (36.9 C)] 98.3 F (36.8 C) (06/01 1403) Pulse Rate:  [52-116] 116 (06/01 1403) Resp:  [17-22] 18 (06/01 0521) BP: (104-128)/(44-99) 104/54 mmHg (06/01 1403) SpO2:  [87 %-100 %] 91 % (06/01 1403) Weight:  [59.693 kg (131 lb 9.6 oz)-68.13 kg (150 lb 3.2 oz)] 68.13 kg (150 lb 3.2 oz) (06/01 0500) Last  BM Date: 02/13/15 General:   Alert,  Well-developed, well-nourished, pleasant and cooperative in NAD Head:  Normocephalic and atraumatic. Eyes:  Sclera clear, no icterus.   Conjunctiva pink. Ears:  Normal auditory acuity. Nose:  No deformity, discharge, or lesions. Mouth:  No deformity or lesions,oropharynx pink & moist. Neck:  Supple; no masses or thyromegaly. Lungs:  Respirations even and unlabored.  Clear throughout to auscultation.   No wheezes, crackles, or rhonchi. No acute distress. Heart:  Regular rate and rhythm; no murmurs, clicks, rubs, or gallops. Abdomen:  Normal bowel sounds.  No bruits.  Soft, non-tender and non-distended without masses, hepatosplenomegaly or hernias noted.  No guarding or rebound tenderness.     Rectal:  Deferred.  Msk:  Symmetrical without gross  deformities.  Good, equal movement & strength bilaterally. Pulses:  Normal pulses noted. Extremities:  No clubbing or edema.  No cyanosis. Neurologic:  Alert and oriented x3;  grossly normal neurologically. Skin:  Intact without significant lesions or rashes.  No jaundice. Lymph Nodes:  No significant cervical adenopathy. Psych:  Alert and cooperative. Normal mood and affect.  LAB RESULTS:  Recent Labs  02/13/15 1617 02/14/15 0735  WBC 20.5* 20.6*  HGB 6.6* 8.1*  HCT 20.9* 24.9*  PLT 269 215   BMET  Recent Labs  02/13/15 1617 02/14/15 0735  NA 135 133*  K 5.2* 4.8  CL 102 104  CO2 12* 18*  GLUCOSE 121* 110*  BUN 60* 76*  CREATININE 2.34* 2.64*  CALCIUM 8.6* 8.0*   STUDIES: Dg Chest 2 View  02/13/2015   CLINICAL DATA:  Increasing shortness of Breath  EXAM: CHEST  2 VIEW  COMPARISON:  None.  FINDINGS: Cardiac shadow is enlarged. The lungs are hyperinflated bilaterally consistent with COPD. No focal infiltrate or sizable effusion is noted. Degenerative change of the thoracic spine is seen. Mild aortic calcifications are noted.  IMPRESSION: COPD without acute abnormality.   Electronically Signed   By: Inez Catalina M.D.   On: 02/13/2015 16:57   Ct Head Wo Contrast  02/13/2015   CLINICAL DATA:  Headaches  EXAM: CT HEAD WITHOUT CONTRAST  TECHNIQUE: Contiguous axial images were obtained from the base of the skull through the vertex without intravenous contrast.  COMPARISON:  None.  FINDINGS: Bony calvarium is intact. Mild atrophic changes are noted. No findings to suggest acute hemorrhage, acute infarction or space-occupying mass lesion are noted.  IMPRESSION: Atrophic changes without acute abnormality.   Electronically Signed   By: Inez Catalina M.D.   On: 02/13/2015 17:10      Impression / Plan:   William Tapia is a 81 y.o. y/o male with melena shortly after starting Xarelto for new onset atrial fibrillation.  He has had appropriate transfusion response after 2 units of packed  RBCs. Melena has resolved for the moment. He did consume 2-4 alcoholic beverages daily for many years but has no known history of cirrhosis.  He will need EGD with Dr. Allen Norris tomorrow to further evaluate for upper GI bleeding source including peptic ulcer disease, AVM, or Cameron lesion.  I have discussed risks & benefits which include, but are not limited to, bleeding, infection, perforation & drug reaction.  The patient agrees with this plan & written consent will be obtained.    Plan: #1 agree with Protonix #2 NPO after midnight #3 EGD with Dr. Allen Norris tomorrow #4 agree with holding Xarelto for now and we may need to involve cardiology as far as his anticoagulation management for his  atrial fibrillation  Thank you for involving me in the care of this patient.    LOS: 1 day    Vickey Huger, NP  02/14/2015, 3:07 PM [facility]  [place],  [address] Phone: [phone] Fax : [phone]

## 2015-02-15 ENCOUNTER — Inpatient Hospital Stay: Payer: Commercial Managed Care - HMO | Admitting: Anesthesiology

## 2015-02-15 ENCOUNTER — Inpatient Hospital Stay
Admit: 2015-02-15 | Discharge: 2015-02-15 | Disposition: A | Discharge status: Home / Self Care | Payer: Commercial Managed Care - HMO | Attending: Internal Medicine | Admitting: Internal Medicine

## 2015-02-15 ENCOUNTER — Encounter
Admission: EM | Disposition: A | Discharge status: Home / Self Care | Payer: Self-pay | Source: Home / Self Care | Attending: Internal Medicine

## 2015-02-15 DIAGNOSIS — D649 Anemia, unspecified: Secondary | ICD-10-CM

## 2015-02-15 DIAGNOSIS — K921 Melena: Secondary | ICD-10-CM

## 2015-02-15 DIAGNOSIS — K7200 Acute and subacute hepatic failure without coma: Secondary | ICD-10-CM

## 2015-02-15 DIAGNOSIS — K922 Gastrointestinal hemorrhage, unspecified: Secondary | ICD-10-CM

## 2015-02-15 LAB — CBC WITH DIFFERENTIAL/PLATELET
BASOS PCT: 0 %
Basophils Absolute: 0.1 10*3/uL (ref 0–0.1)
EOS PCT: 0 %
Eosinophils Absolute: 0 10*3/uL (ref 0–0.7)
HEMATOCRIT: 25.2 % — AB (ref 40.0–52.0)
Hemoglobin: 8.3 g/dL — ABNORMAL LOW (ref 13.0–18.0)
LYMPHS ABS: 0.8 10*3/uL — AB (ref 1.0–3.6)
LYMPHS PCT: 5 %
MCH: 31.3 pg (ref 26.0–34.0)
MCHC: 33 g/dL (ref 32.0–36.0)
MCV: 94.7 fL (ref 80.0–100.0)
MONO ABS: 1.6 10*3/uL — AB (ref 0.2–1.0)
Monocytes Relative: 11 %
NEUTROS ABS: 13.1 10*3/uL — AB (ref 1.4–6.5)
NEUTROS PCT: 84 %
PLATELETS: 177 10*3/uL (ref 150–440)
RBC: 2.66 MIL/uL — ABNORMAL LOW (ref 4.40–5.90)
RDW: 15.4 % — ABNORMAL HIGH (ref 11.5–14.5)
WBC: 15.6 10*3/uL — ABNORMAL HIGH (ref 3.8–10.6)

## 2015-02-15 LAB — TYPE AND SCREEN
ABO/RH(D): O POS
ANTIBODY SCREEN: NEGATIVE
Unit division: 0
Unit division: 0

## 2015-02-15 LAB — HEPATIC FUNCTION PANEL
ALBUMIN: 2.8 g/dL — AB (ref 3.5–5.0)
ALK PHOS: 189 U/L — AB (ref 38–126)
ALT: 1436 U/L — ABNORMAL HIGH (ref 17–63)
AST: 1282 U/L — AB (ref 15–41)
BILIRUBIN TOTAL: 1.7 mg/dL — AB (ref 0.3–1.2)
Bilirubin, Direct: 0.7 mg/dL — ABNORMAL HIGH (ref 0.1–0.5)
Indirect Bilirubin: 1 mg/dL — ABNORMAL HIGH (ref 0.3–0.9)
Total Protein: 5.1 g/dL — ABNORMAL LOW (ref 6.5–8.1)

## 2015-02-15 LAB — IRON AND TIBC
Iron: 12 ug/dL — ABNORMAL LOW (ref 45–182)
Saturation Ratios: 5 % — ABNORMAL LOW (ref 17.9–39.5)
TIBC: 263 ug/dL (ref 250–450)
UIBC: 251 ug/dL

## 2015-02-15 LAB — PROTIME-INR
INR: 3.41
INR: 3.01
Prothrombin Time: 34.4 seconds — ABNORMAL HIGH (ref 11.4–15.0)
Prothrombin Time: 31.3 s — ABNORMAL HIGH (ref 11.4–15.0)

## 2015-02-15 LAB — AMMONIA: Ammonia: <9 umol/L — ABNORMAL LOW (ref 9–35)

## 2015-02-15 LAB — URINALYSIS COMPLETE WITH MICROSCOPIC (ARMC ONLY)
Bilirubin Urine: NEGATIVE
Glucose, UA: NEGATIVE mg/dL
Hgb urine dipstick: NEGATIVE
KETONES UR: NEGATIVE mg/dL
Leukocytes, UA: NEGATIVE
NITRITE: NEGATIVE
Protein, ur: NEGATIVE mg/dL
Specific Gravity, Urine: 1.015 (ref 1.005–1.030)
pH: 5 (ref 5.0–8.0)

## 2015-02-15 LAB — BASIC METABOLIC PANEL
Anion gap: 9 (ref 5–15)
BUN: 72 mg/dL — ABNORMAL HIGH (ref 6–20)
CHLORIDE: 106 mmol/L (ref 101–111)
CO2: 17 mmol/L — ABNORMAL LOW (ref 22–32)
Calcium: 7.3 mg/dL — ABNORMAL LOW (ref 8.9–10.3)
Creatinine, Ser: 2.27 mg/dL — ABNORMAL HIGH (ref 0.61–1.24)
GFR calc Af Amer: 28 mL/min — ABNORMAL LOW (ref >60)
GFR calc non Af Amer: 24 mL/min — ABNORMAL LOW (ref >60)
Glucose, Bld: 105 mg/dL — ABNORMAL HIGH (ref 65–99)
Potassium: 3.6 mmol/L (ref 3.5–5.1)
Sodium: 132 mmol/L — ABNORMAL LOW (ref 135–145)

## 2015-02-15 LAB — ACETAMINOPHEN LEVEL: Acetaminophen (Tylenol), Serum: <10 ug/mL — ABNORMAL LOW (ref 10–30)

## 2015-02-15 LAB — FERRITIN: Ferritin: 402 ng/mL — ABNORMAL HIGH (ref 24–336)

## 2015-02-15 SURGERY — ESOPHAGOGASTRODUODENOSCOPY (EGD) WITH PROPOFOL
Anesthesia: Choice

## 2015-02-15 MED ORDER — SODIUM CHLORIDE 0.9 % IV SOLN
INTRAVENOUS | Status: AC
  Administered 2015-02-16 (×2): via INTRAVENOUS

## 2015-02-15 NOTE — Plan of Care (Addendum)
Problem: Discharge Progression Outcomes Goal: Discharge plan in place and appropriate Individualization  Address pt as William Tapia. HOH. High fall risk- Offer toileting qx1hr with safety checks. H/o HTN, high cholesterol- controlled with home meds. H/o A.Fib- xarelto held. Goal: Other Discharge Outcomes/Goals Outcome: Progressing Plan of care progress to goals: BP/HR stable. Metoprolol held at bedtime due to HR in the low 60's, MD aware. Continue IVF. Denies pain. No signs of bleeding. No BM during the night. Pt refused to sign consent for EGD tonight. Pt verbalized that he want to sign the consent prior to the procedure and that MD will explain the procedure again. NPO since midnight.

## 2015-02-15 NOTE — Progress Notes (Signed)
Spoke with Dr sparks regarding IVF order expiration. MD verbalized to continue IVF.

## 2015-02-15 NOTE — Plan of Care (Signed)
Problem: Discharge Progression Outcomes Goal: Other Discharge Outcomes/Goals Outcome: Progressing Plan of care with progress to goal  egd not preformed today  / pt requires  Cardiology consult  And clearance before  egd occur.no c/o pain.  Assisted to amb to bath room and tol well.  tol clear liquid diet well.

## 2015-02-15 NOTE — Progress Notes (Signed)
*  PRELIMINARY RESULTS* Echocardiogram 2D Echocardiogram has been performed.  William Tapia 02/15/2015, 4:53 PM

## 2015-02-15 NOTE — Progress Notes (Addendum)
INR greater than 3, rechecked.  ?underlying liver disease.  Discussed with Dr Allen Norris.  He will proceed with EGD today to evaluate melena. Vickey Huger, NP  Addendum:  1:45 PM  Cardiology consult placed for clearance prior to EGD with elevated troponins Vickey Huger, NP

## 2015-02-15 NOTE — Progress Notes (Addendum)
   Subjective: Patient is more confused today. Son states this is not his normal mentation. He has just returned from echo. EGD was postponed due to elevated troponins. Awaiting cardiology recommendations prior to procedure.  His INR is elevated.  No further melena, nausea, vomiting, or dominant pain.  Objective: Vital signs in last 24 hours: Temp:  [97.6 F (36.4 C)-98.1 F (36.7 C)] 97.6 F (36.4 C) (06/02 1355) Pulse Rate:  [61-71] 67 (06/02 1355) Resp:  [18-20] 20 (06/02 1048) BP: (115-133)/(51-62) 121/58 mmHg (06/02 1355) SpO2:  [96 %-98 %] 98 % (06/02 1355) Weight:  [70.444 kg (155 lb 4.8 oz)] 70.444 kg (155 lb 4.8 oz) (06/02 0500) Last BM Date: 02/13/15 No LMP for male patient. Body mass index is 25.08 kg/(m^2). General:   Alert,  Well-developed, well-nourished, pleasant and cooperative in NAD, son and daughter-in-law at the bedside Head:  Normocephalic and atraumatic. Eyes:  Sclera clear, no icterus.   Conjunctiva pink. Mouth:  No deformity or lesions, oropharynx pink & moist. Neck:  Supple; no masses or thyromegaly. Heart:  Regular rate and rhythm; no murmurs, clicks, rubs, or gallops. Abdomen:   Normal bowel sounds.  Soft, nontender and nondistended. No masses, hepatosplenomegaly or hernias noted.  No guarding or rebound tenderness.   Msk:  Symmetrical without gross deformities. Good equal movement & strength bilaterally. Pulses:  Normal pulses noted. Extremities:  Without clubbing or edema.  No cyanosis Neurologic:  Alert and  oriented x2, he is more confused today Skin:  Intact without significant lesions or rashes. Cervical Nodes:  No significant cervical adenopathy. Psych:  Alert and cooperative. Confusion.  Intake/Output from previous day: 06/01 0701 - 06/02 0700 In: 848 [I.V.:848] Out: -   Lab Results:  Recent Labs  02/13/15 1617 02/14/15 0735 02/15/15 0532  WBC 20.5* 20.6* 15.6*  HGB 6.6* 8.1* 8.3*  HCT 20.9* 24.9* 25.2*  PLT 269 215 177    BMET  Recent Labs  02/13/15 1617 02/14/15 0735 02/15/15 0532  NA 135 133* 132*  K 5.2* 4.8 3.6  CL 102 104 106  CO2 12* 18* 17*  GLUCOSE 121* 110* 105*  BUN 60* 76* 72*  CREATININE 2.34* 2.64* 2.27*  CALCIUM 8.6* 8.0* 7.3*   PT/INR  Recent Labs  02/15/15 0532 02/15/15 0845  LABPROT 34.4* 31.3*  INR 3.41 3.01   Assessment: #1 melena:  No melena today.  EGD once cleared by cardiology #2 coagulopathy: He has been on Xarelto.  ?underlying cirrhosis given chronic alcohol use #3 anemia second to GI bleed: Hemoglobin stable status post transfusion  Plan: #1 continue PPI #2 check INR, CBC AM #3 ammonia, LFTS now #4 consider abdominal ultrasound to further evaluate for possible occult cirrhosis   LOS: 2 days  Vickey Huger  02/15/2015, 5:10 PM [facility]  [place], [place] [address] Phone: [phone] Fax : [phone]  Addendum:7:05 PM  New labs reviewed.  Pt has significantly elevated LFTS ?shock liver (ischemia), medication-induced, or infection.  Will need ABD [place] & further hepatic serologic workup.  Discussed his care with Dr Allen Norris.

## 2015-02-15 NOTE — Progress Notes (Signed)
Late entry. On [id number] at 2145, RN stayed with pt for 36mins with start of transfusion with pt tolerating well. Resting quietly. Geni Bers RN

## 2015-02-15 NOTE — Progress Notes (Signed)
PROGRESS NOTE  William Tapia ASN:[id number] DOB: December 22, 1924 DOA: 02/13/2015 PCP: Tracie Harrier, MD  HPI/Recap of past 24 hours: 81  Y/o male with hx of HTN, Recent onset A.Fib, Chronic back pain admitted with exertional shortness of breath, Melanotic stool . Hgb was 6.6 on admission Had been recently started on Xarelto for A-Fib Pt received 2 units of PRBC over night. This am appears confused   Consultants: GI   Objective: BP 115/62 mmHg  Pulse 65  Temp(Src) 98.1 F (36.7 C) (Oral)  Resp 20  Ht 5\' 6"  (1.676 m)  Wt 70.444 kg (155 lb 4.8 oz)  BMI 25.08 kg/m2  SpO2 98%  Intake/Output Summary (Last 24 hours) at 02/15/15 0815 Last data filed at 02/14/15 1720  Gross per 24 hour  Intake    848 ml  Output      0 ml  Net    848 ml   Filed Weights   02/13/15 1950 02/14/15 0500 02/15/15 0500  Weight: 59.693 kg (131 lb 9.6 oz) 68.13 kg (150 lb 3.2 oz) 70.444 kg (155 lb 4.8 oz)    Exam:   General:  Not in sdistress  Cardiovascular: S1 S2  Respiratory: Clear to auscultation  Abdomen: Soft. Non tender  Neuro: Confused .Non Focal  Data Reviewed: Basic Metabolic Panel:  Recent Labs Lab 02/13/15 1617 02/14/15 0735 02/15/15 0532  NA 135 133* 132*  K 5.2* 4.8 3.6  CL 102 104 106  CO2 12* 18* 17*  GLUCOSE 121* 110* 105*  BUN 60* 76* 72*  CREATININE 2.34* 2.64* 2.27*  CALCIUM 8.6* 8.0* 7.3*   Liver Function Tests: No results for input(s): AST, ALT, ALKPHOS, BILITOT, PROT, ALBUMIN in the last 168 hours. No results for input(s): LIPASE, AMYLASE in the last 168 hours. No results for input(s): AMMONIA in the last 168 hours. CBC:  Recent Labs Lab 02/13/15 1617 02/14/15 0735 02/15/15 0532  WBC 20.5* 20.6* 15.6*  NEUTROABS  --   --  13.1*  HGB 6.6* 8.1* 8.3*  HCT 20.9* 24.9* 25.2*  MCV 101.0* 95.8 94.7  PLT 269 215 177   Cardiac Enzymes:    Recent Labs Lab 02/13/15 1617 02/13/15 1937 02/14/15 0107 02/14/15 0735  TROPONINI 0.40* 0.32* 0.42* 0.39*    BNP (last 3 results)  Recent Labs  02/13/15 1617  BNP 839.0*    ProBNP (last 3 results) No results for input(s): PROBNP in the last 8760 hours.  CBG: No results for input(s): GLUCAP in the last 168 hours.  No results found for this or any previous visit (from the past 240 hour(s)).   Studies: No results found.  Scheduled Meds: . diltiazem  240 mg Oral Daily  . metoprolol tartrate  25 mg Oral BID  . pravastatin  20 mg Oral q1800  . sodium chloride  3 mL Intravenous Q12H    Continuous Infusions: . sodium chloride 75 mL/hr at 02/14/15 2319  . pantoprozole (PROTONIX) infusion 8 mg/hr (02/15/15 0415)    Assessment/Plan: 1 Acute GI bleed with blood loss anemia; Most likely  Upper GI source; Hgb is stable at 8.3 Continue IV Protonix For EGD today 2 Recent onset - A- Fib-On Cardizem and Metoprolol 3 Acute Renal Failure; Se Creat improved to 2,.27.Continue IV hydration. 4 Elevated Troponin; Probable demand ischemia. 5 Hyperlipidemia; On Pravachol  6 Mental status change with confusion ? delerium Continue to watch 7 Leukocytosis: Check ua today 8 Physical Therapy   Code Status: DNR/DNI        William Tapia  02/15/2015, 8:15 AM  LOS: 2 days

## 2015-02-16 ENCOUNTER — Inpatient Hospital Stay: Payer: Commercial Managed Care - HMO

## 2015-02-16 DIAGNOSIS — D649 Anemia, unspecified: Secondary | ICD-10-CM

## 2015-02-16 DIAGNOSIS — K7200 Acute and subacute hepatic failure without coma: Secondary | ICD-10-CM

## 2015-02-16 DIAGNOSIS — K922 Gastrointestinal hemorrhage, unspecified: Secondary | ICD-10-CM

## 2015-02-16 DIAGNOSIS — K921 Melena: Secondary | ICD-10-CM

## 2015-02-16 LAB — CBC WITH DIFFERENTIAL/PLATELET
Basophils Absolute: 0 10*3/uL (ref 0–0.1)
Basophils Relative: 0 %
Eosinophils Absolute: 0.1 10*3/uL (ref 0–0.7)
Eosinophils Relative: 1 %
HEMATOCRIT: 25.1 % — AB (ref 40.0–52.0)
Hemoglobin: 8.1 g/dL — ABNORMAL LOW (ref 13.0–18.0)
LYMPHS PCT: 5 %
Lymphs Abs: 0.7 10*3/uL — ABNORMAL LOW (ref 1.0–3.6)
MCH: 30.6 pg (ref 26.0–34.0)
MCHC: 32.2 g/dL (ref 32.0–36.0)
MCV: 95.1 fL (ref 80.0–100.0)
MONOS PCT: 11 %
Monocytes Absolute: 1.4 10*3/uL — ABNORMAL HIGH (ref 0.2–1.0)
NEUTROS ABS: 10.4 10*3/uL — AB (ref 1.4–6.5)
Neutrophils Relative %: 83 %
PLATELETS: 149 10*3/uL — AB (ref 150–440)
RBC: 2.64 MIL/uL — AB (ref 4.40–5.90)
RDW: 16.2 % — AB (ref 11.5–14.5)
WBC: 12.6 10*3/uL — AB (ref 3.8–10.6)

## 2015-02-16 LAB — HEPATIC FUNCTION PANEL
ALBUMIN: 2.9 g/dL — AB (ref 3.5–5.0)
ALT: 1402 U/L — AB (ref 17–63)
AST: 1106 U/L — AB (ref 15–41)
Alkaline Phosphatase: 187 U/L — ABNORMAL HIGH (ref 38–126)
BILIRUBIN INDIRECT: 1 mg/dL — AB (ref 0.3–0.9)
BILIRUBIN TOTAL: 1.7 mg/dL — AB (ref 0.3–1.2)
Bilirubin, Direct: 0.7 mg/dL — ABNORMAL HIGH (ref 0.1–0.5)
Total Protein: 5.2 g/dL — ABNORMAL LOW (ref 6.5–8.1)

## 2015-02-16 LAB — BASIC METABOLIC PANEL
Anion gap: 6 (ref 5–15)
BUN: 41 mg/dL — AB (ref 6–20)
CHLORIDE: 110 mmol/L (ref 101–111)
CO2: 21 mmol/L — AB (ref 22–32)
Calcium: 7.3 mg/dL — ABNORMAL LOW (ref 8.9–10.3)
Creatinine, Ser: 1.39 mg/dL — ABNORMAL HIGH (ref 0.61–1.24)
GFR calc non Af Amer: 44 mL/min — ABNORMAL LOW (ref >60)
GFR, EST AFRICAN AMERICAN: 51 mL/min — AB (ref >60)
Glucose, Bld: 102 mg/dL — ABNORMAL HIGH (ref 65–99)
Potassium: 3.6 mmol/L (ref 3.5–5.1)
Sodium: 137 mmol/L (ref 135–145)

## 2015-02-16 LAB — PROTIME-INR
INR: 1.78
Prothrombin Time: 20.9 seconds — ABNORMAL HIGH (ref 11.4–15.0)

## 2015-02-16 MED ORDER — PANTOPRAZOLE SODIUM 40 MG PO TBEC
40.0000 mg | DELAYED_RELEASE_TABLET | Freq: Two times a day (BID) | ORAL | Status: DC
  Administered 2015-02-16 – 2015-02-20 (×8): 40 mg via ORAL
  Filled 2015-02-16 (×8): qty 1

## 2015-02-16 NOTE — Plan of Care (Signed)
Problem: Discharge Progression Outcomes Goal: Discharge plan in place and appropriate Individualization  Address pt as William Tapia. HOH. High fall risk- Offer toileting qx1hr with safety checks. H/o HTN, high cholesterol- controlled with home meds. H/o A.Fib- xarelto held. Goal: Other Discharge Outcomes/Goals Outcome: Progressing Plan of care progress to goals: BP/HR stable. AFib HR in the 60's per telemetry monitor. Denies pain. Dyspnea on exertion. NPO since midnight for abd [place]. Cardiac consult pending.

## 2015-02-16 NOTE — Plan of Care (Signed)
Problem: Discharge Progression Outcomes Goal: Other Discharge Outcomes/Goals Plan of Care Progress to Goal:  Pt with gi bleed, no active bleeding noted, cardiology consult completed.  Pt started on clear liquids per orders and labs ordered for tomorrow am.  Pt without c/o pain.

## 2015-02-16 NOTE — Consult Note (Signed)
[facility] Consultation Note  Patient ID: William Tapia, MRN: [id number], DOB/AGE: Apr 24, 1925 81 y.o. Admit date: 02/13/2015   Date of Consult: 02/16/2015 Primary Physician: Tracie Harrier, MD Primary Cardiologist: [facility]  Chief Complaint:  Chief Complaint  Patient presents with  . Shortness of Breath   Reason for Consult: atrial fibrillation with variable heart rate with GI bleed and elevated troponin  HPI: 81 y.o. male with the known essential hypertension and new onset atrial fibrillation with rapid ventricular rate for which the placement was placed on metoprolol with good heart rate control and remaining in atrial fibrillation at this time. Ration also was placed on anticoagulation for which she after several doses had a significant black stool consistent with the GI bleed. The patient then was anemic with severe shortness of breath weakness and fatigue and also has some chronic kidney disease stage III possibly exacerbating above. Since transfusions the patient has had some improvements of shortness of breath that there is been no evidence of congestive heart failure or evidence of anginal symptoms consistent with myocardial infarction or true ischemia. The patient does have an elevated troponin of 0.4 to most consistent with demand ischemia rather than issues with true myocardial infarction and/or acute coronary syndrome. Currently he is stable at this time  Past Medical History  Diagnosis Date  . A-fib   . High cholesterol   . PMR (polymyalgia rheumatica)   . Chronic back pain   . Osteoarthritis   . Hypertension   . CKD (chronic kidney disease)       Surgical History:  Past Surgical History  Procedure Laterality Date  . Hernia repair Right   . Ganglion cyst excision      foot  . Cataract extraction       Home Meds: Prior to Admission medications   Medication Sig Start Date End Date Taking? Authorizing Provider  CARTIA XT 240 MG 24 hr capsule Take 1  capsule by mouth daily. 11/29/14  Yes Historical Provider, MD  Coenzyme Q10 (COQ-10 PO) Take 1 tablet by mouth daily.   Yes Historical Provider, MD  cyanocobalamin 1000 MCG tablet Take 1,000 mcg by mouth daily.   Yes Historical Provider, MD  lovastatin (MEVACOR) 20 MG tablet Take 20 mg by mouth daily.   Yes Historical Provider, MD  metoprolol tartrate (LOPRESSOR) 25 MG tablet Take 25 mg by mouth 2 (two) times daily.   Yes Historical Provider, MD  rivaroxaban (XARELTO) 20 MG TABS tablet Take 20 mg by mouth daily.   Yes Historical Provider, MD  VENTOLIN HFA 108 (90 BASE) MCG/ACT inhaler Take 2 puffs by mouth every 6 (six) hours as needed. For wheezing 12/26/14  Yes Historical Provider, MD    Inpatient Medications:  . diltiazem  240 mg Oral Daily  . metoprolol tartrate  25 mg Oral BID  . sodium chloride  3 mL Intravenous Q12H   . sodium chloride 75 mL/hr at 02/16/15 0728  . pantoprozole (PROTONIX) infusion 8 mg/hr (02/16/15 0160)    Allergies: No Known Allergies  History   Social History  . Marital Status: Married    Spouse Name: N/A  . Number of Children: 2  . Years of Education: N/A   Occupational History  . retired Orthoptist    Social History Main Topics  . Smoking status: Former Smoker -- 35 years    Types: Cigarettes    Quit date: 02/13/2010  . Smokeless tobacco: Not on file  . Alcohol Use: No  Comment: 3 beers a day for many years and quit 02/06/2015  . Drug Use: No  . Sexual Activity: Not on file   Other Topics Concern  . Not on file   Social History Narrative   Lives with wife & Suzie, his daughter with Down syndrome        Family History  Problem Relation Age of Onset  . Family history unknown: Yes     Review of Systems Positive for shortness of breath and weakness Negative for: General:  chills, fever, night sweats or weight changes.  Cardiovascular: PND orthopnea syncope dizziness  Dermatological skin lesions rashes Respiratory: Cough  congestion Urologic: Frequent urination urination at night and hematuria Abdominal: negative for nausea, vomiting, diarrhea,   or hematemesis Neurologic: negative for visual changes, and/or hearing changes  All other systems reviewed and are otherwise negative except as noted above.  Labs:  Recent Labs  02/13/15 1937 02/14/15 0107 02/14/15 0735  TROPONINI 0.32* 0.42* 0.39*   Lab Results  Component Value Date   WBC 12.6* 02/16/2015   HGB 8.1* 02/16/2015   HCT 25.1* 02/16/2015   MCV 95.1 02/16/2015   PLT 149* 02/16/2015    Recent Labs Lab 02/16/15 0516  NA 137  K 3.6  CL 110  CO2 21*  BUN 41*  CREATININE 1.39*  CALCIUM 7.3*  PROT 5.2*  BILITOT 1.7*  ALKPHOS 187*  ALT 1402*  AST 1106*  GLUCOSE 102*   No results found for: CHOL, HDL, LDLCALC, TRIG No results found for: DDIMER  Radiology/Studies:  Dg Chest 2 View  02/13/2015   CLINICAL DATA:  Increasing shortness of Breath  EXAM: CHEST  2 VIEW  COMPARISON:  None.  FINDINGS: Cardiac shadow is enlarged. The lungs are hyperinflated bilaterally consistent with COPD. No focal infiltrate or sizable effusion is noted. Degenerative change of the thoracic spine is seen. Mild aortic calcifications are noted.  IMPRESSION: COPD without acute abnormality.   Electronically Signed   By: Inez Catalina M.D.   On: 02/13/2015 16:57   Ct Head Wo Contrast  02/13/2015   CLINICAL DATA:  Headaches  EXAM: CT HEAD WITHOUT CONTRAST  TECHNIQUE: Contiguous axial images were obtained from the base of the skull through the vertex without intravenous contrast.  COMPARISON:  None.  FINDINGS: Bony calvarium is intact. Mild atrophic changes are noted. No findings to suggest acute hemorrhage, acute infarction or space-occupying mass lesion are noted.  IMPRESSION: Atrophic changes without acute abnormality.   Electronically Signed   By: Inez Catalina M.D.   On: 02/13/2015 17:10   US Abdomen Complete  02/16/2015   CLINICAL DATA:  Elevated LFTs, history chronic  kidney disease, hypertension, hypercholesterolemia, former smoker  EXAM: ULTRASOUND ABDOMEN COMPLETE  COMPARISON:  None  FINDINGS: Gallbladder: Markedly thickened gallbladder wall with edema/striations within wall. No definite gallstones, pericholecystic fluid, or sonographic Murphy sign identified.  Common bile duct: Diameter: 4 mm diameter, normal  Liver: Upper normal echogenicity. Question bidirectional flow within portal vein. No focal hepatic mass or nodularity.  IVC: Distended IVC and hepatic veins question related RIGHT elevated heart pressure.  Pancreas: Normal appearance  Spleen: Normal appearance, 4.1 cm length  Right Kidney: Length: 10.9 cm. No definite solid mass or hydronephrosis. Probable cyst upper pole 18 x 12 x 14 mm, appears complicated on sagittal images. Mild cortical thinning with normal cortical echogenicity.  Left Kidney: Length: 11.8 cm. Normal morphology without mass or hydronephrosis.  Abdominal aorta: Normal caliber  Other findings: Small amount of ascites. BILATERAL  pleural effusions.  IMPRESSION: BILATERAL pleural fusions and small amount of ascites.  Thickened gallbladder wall without definite stones or sonographic Murphy sign; this is a nonspecific finding in the setting of ascites.  Suspected bidirectional flow within the portal vein, which can be seen with portal hypertension, though this is not a dedicated hepatic vascular exam and waveforms were not obtained ; consider follow-up hepatic vascular ultrasound assessment.  Probable complicated cyst RIGHT kidney requiring further assessment to characterize ; recommend MR imaging with and without contrast to evaluate.   Electronically Signed   By: Lavonia Dana M.D.   On: 02/16/2015 11:45    EKG: Atrial fibrillation with nonspecific ST changes and controlled ventricular rate  Weights: Filed Weights   02/14/15 0500 02/15/15 0500 02/16/15 0500  Weight: 150 lb 3.2 oz (68.13 kg) 155 lb 4.8 oz (70.444 kg) 148 lb 4.8 oz (67.268 kg)      Physical Exam: Blood pressure 116/54, pulse 63, temperature 98.1 F (36.7 C), temperature source Oral, resp. rate 18, height 5\' 6"  (1.676 m), weight 148 lb 4.8 oz (67.268 kg), SpO2 99 %. Body mass index is 23.95 kg/(m^2). General: Well developed, well nourished, in no acute distress. Head eyes ears nose throat: Normocephalic, atraumatic, sclera non-icteric, no xanthomas, nares are without discharge. No apparent thyromegaly and/or mass  Lungs: Normal respiratory effort.  no wheezes, no rales, no rhonchi.  Heart: Irregular with normal S1 S2. no murmur gallop, no rub, PMI is normal size and placement, carotid upstroke normal without bruit, jugular venous pressure is normal Abdomen: Soft, non-tender, non-distended with normoactive bowel sounds. No hepatomegaly. No rebound/guarding. No obvious abdominal masses. Abdominal aorta is normal size without bruit Extremities: Trace edema. no cyanosis, no clubbing, no ulcers  Peripheral : 2+ bilateral upper extremity pulses, 2+ bilateral femoral pulses, 2+ bilateral dorsal pedal pulse Neuro: Alert and oriented. No facial asymmetry. No focal deficit. Moves all extremities spontaneously. Musculoskeletal: Normal muscle tone without kyphosis Psych:  Responds to questions appropriately with a normal affect.    Assessment: 81 year old male with known essential hypertension chronic kidney disease stage III with new onset atrial fibrillation now controlled with metoprolol and having a new onset GI bleed with out evidence of congestive heart failure or significant myocardial infarction with a level elevated troponin most consistent with demand ischemia rather than acute coronary syndrome. Patient is stable at this time and is at [facility] risk possible for cardiovascular complication with upper endoscopy for further evaluation of significant causes of GI bleed  Plan: 1. Abstain from anticoagulation due to GI bleed 2. Continue metoprolol for heart rate control 3.  No further intervention of minimal elevation of troponin consistent with a band ischemia rather than acute coronary syndrome 4. Begin ambulation and follow for any further significant symptoms and need for adjustments of the treatment options 5. Echocardiogram pending  Signed, Corey Skains M.D. [facility] 02/16/2015, 4:32 PM

## 2015-02-16 NOTE — Progress Notes (Signed)
Subjective: Patient is more alert today. Denies any further melena, nausea, vomiting, or abdominal pain.  Objective: Vital signs in last 24 hours: Temp:  [98 F (36.7 C)-98.1 F (36.7 C)] 98.1 F (36.7 C) (06/03 1507) Pulse Rate:  [60-71] 63 (06/03 1507) Resp:  [18-21] 18 (06/03 1507) BP: (116-124)/(52-63) 116/54 mmHg (06/03 1507) SpO2:  [97 %-100 %] 99 % (06/03 1507) Weight:  [67.268 kg (148 lb 4.8 oz)] 67.268 kg (148 lb 4.8 oz) (06/03 0500) Last BM Date: 02/13/15 No LMP for male patient. Body mass index is 23.95 kg/(m^2). General:   Alert,  Well-developed, well-nourished, pleasant and cooperative in NAD, wife at the bedside Head:  Normocephalic and atraumatic. Eyes:  Sclera clear, no icterus.   Conjunctiva pink. Mouth:  No deformity or lesions, oropharynx pink & moist. Neck:  Supple; no masses or thyromegaly. Heart:  Regular rate and rhythm; no murmurs, clicks, rubs, or gallops. Abdomen:   Normal bowel sounds.  Soft, nontender and nondistended. No masses, hepatosplenomegaly or hernias noted.  No guarding or rebound tenderness.   Msk:  Symmetrical without gross deformities. Good equal movement & strength bilaterally. Pulses:  Normal pulses noted. Extremities:  Without clubbing or edema.  No cyanosis.  No asterixis. Neurologic:  Alert and  oriented x3, grossly normal neurologically Skin:  Intact without significant lesions or rashes. Cervical Nodes:  No significant cervical adenopathy. Psych:  Alert and cooperative.  Intake/Output from previous day: 06/02 0701 - 06/03 0700 In: 483 [P.O.:480; I.V.:3] Out: 550 [Urine:550]  Lab Results:  Recent Labs  02/14/15 0735 02/15/15 0532 02/16/15 0516  WBC 20.6* 15.6* 12.6*  HGB 8.1* 8.3* 8.1*  HCT 24.9* 25.2* 25.1*  PLT 215 177 149*   BMET  Recent Labs  02/14/15 0735 02/15/15 0532 02/16/15 0516  NA 133* 132* 137  K 4.8 3.6 3.6  CL 104 106 110  CO2 18* 17* 21*  GLUCOSE 110* 105* 102*  BUN 76* 72* 41*  CREATININE  2.64* 2.27* 1.39*  CALCIUM 8.0* 7.3* 7.3*   PT/INR  Recent Labs  02/15/15 0845 02/16/15 0516  LABPROT 31.3* 20.9*  INR 3.01 1.78   Studies/Results: US Abdomen Complete  02/16/2015   CLINICAL DATA:  Elevated LFTs, history chronic kidney disease, hypertension, hypercholesterolemia, former smoker  EXAM: ULTRASOUND ABDOMEN COMPLETE  COMPARISON:  None  FINDINGS: Gallbladder: Markedly thickened gallbladder wall with edema/striations within wall. No definite gallstones, pericholecystic fluid, or sonographic Murphy sign identified.  Common bile duct: Diameter: 4 mm diameter, normal  Liver: Upper normal echogenicity. Question bidirectional flow within portal vein. No focal hepatic mass or nodularity.  IVC: Distended IVC and hepatic veins question related RIGHT elevated heart pressure.  Pancreas: Normal appearance  Spleen: Normal appearance, 4.1 cm length  Right Kidney: Length: 10.9 cm. No definite solid mass or hydronephrosis. Probable cyst upper pole 18 x 12 x 14 mm, appears complicated on sagittal images. Mild cortical thinning with normal cortical echogenicity.  Left Kidney: Length: 11.8 cm. Normal morphology without mass or hydronephrosis.  Abdominal aorta: Normal caliber  Other findings: Small amount of ascites. BILATERAL pleural effusions.  IMPRESSION: BILATERAL pleural fusions and small amount of ascites.  Thickened gallbladder wall without definite stones or sonographic Murphy sign; this is a nonspecific finding in the setting of ascites.  Suspected bidirectional flow within the portal vein, which can be seen with portal hypertension, though this is not a dedicated hepatic vascular exam and waveforms were not obtained ; consider follow-up hepatic vascular ultrasound assessment.  Probable complicated cyst  RIGHT kidney requiring further assessment to characterize ; recommend MR imaging with and without contrast to evaluate.   Electronically Signed   By: Lavonia Dana M.D.   On: 02/16/2015 11:45    Assessment: #1 Melena:  No melena today.  EGD once cleared by cardiology #2 Coagulopathy: INR 1.7 improved.  Chronic ETOH use #3 Elevated LFTS:  Likely drug-induced versus shock liver (ischemia) or infection.  ABD US shows changes of portal HTN & further hepatic serologic workup pending.  Discussed his care with Dr Allen Norris. #4 Iron deficiency anemia/ secondary to GI bleed: Hemoglobin stable status post transfusion #5 Right renal cyst:  Will need FU for this per hospitalist  Plan: #1 advance to clears #2 EGD once cleared from cardiology #3 await hepatic serologic workup #4 LFTS, INR in AM  [facility] will be for [place] covering over the weekend if needed     LOS: 3 days  Vickey Huger  02/16/2015, 4:46 PM [facility]  [address] [place], [place] [address] Phone: [phone] Fax : [phone]

## 2015-02-16 NOTE — Progress Notes (Signed)
PROGRESS NOTE  William Tapia [id number] DOB: November 28, 1924 DOA: 02/13/2015 PCP: Tracie Harrier, MD  HPI/Recap of past 24 hours: 81  Y/o male with hx of HTN, Recent onset A.Fib, Chronic back pain admitted with exertional shortness of breath, Melanotic stool . Hgb was 6.6 on admission Had been recently started on Xarelto for A-Fib Pt received 2 units of PRBC - Hgb is stable at 8.1 Abnormal LFT's noted. [place] is trending down Renal function improving. Pt tolerating clear liquid diet   Consultants: GI and Cardiology   Objective: BP 124/52 mmHg  Pulse 60  Temp(Src) 98.1 F (36.7 C) (Oral)  Resp 18  Ht 5\' 6"  (1.676 m)  Wt 67.268 kg (148 lb 4.8 oz)  BMI 23.95 kg/m2  SpO2 97%  Intake/Output Summary (Last 24 hours) at 02/16/15 0816 Last data filed at 02/15/15 2305  Gross per 24 hour  Intake    483 ml  Output    550 ml  Net    -67 ml   Filed Weights   02/14/15 0500 02/15/15 0500 02/16/15 0500  Weight: 68.13 kg (150 lb 3.2 oz) 70.444 kg (155 lb 4.8 oz) 67.268 kg (148 lb 4.8 oz)    Exam:   General:  Not in distress  Cardiovascular: S1 S2  Respiratory: Clear to auscultation  Abdomen: Soft. Non tender  Neuro:  .Non Focal  Data Reviewed: Basic Metabolic Panel:  Recent Labs Lab 02/13/15 1617 02/14/15 0735 02/15/15 0532 02/16/15 0516  NA 135 133* 132* 137  K 5.2* 4.8 3.6 3.6  CL 102 104 106 110  CO2 12* 18* 17* 21*  GLUCOSE 121* 110* 105* 102*  BUN 60* 76* 72* 41*  CREATININE 2.34* 2.64* 2.27* 1.39*  CALCIUM 8.6* 8.0* 7.3* 7.3*   Liver Function Tests:  Recent Labs Lab 02/15/15 1748 02/16/15 0516  AST 1282* 1106*  ALT 1436* 1402*  ALKPHOS 189* 187*  BILITOT 1.7* 1.7*  PROT 5.1* 5.2*  ALBUMIN 2.8* 2.9*   No results for input(s): LIPASE, AMYLASE in the last 168 hours.  Recent Labs Lab 02/15/15 1748  AMMONIA <9*   CBC:  Recent Labs Lab 02/13/15 1617 02/14/15 0735 02/15/15 0532 02/16/15 0516  WBC 20.5* 20.6* 15.6* 12.6*  NEUTROABS  --    --  13.1* 10.4*  HGB 6.6* 8.1* 8.3* 8.1*  HCT 20.9* 24.9* 25.2* 25.1*  MCV 101.0* 95.8 94.7 95.1  PLT 269 215 177 149*   Cardiac Enzymes:    Recent Labs Lab 02/13/15 1617 02/13/15 1937 02/14/15 0107 02/14/15 0735  TROPONINI 0.40* 0.32* 0.42* 0.39*   BNP (last 3 results)  Recent Labs  02/13/15 1617  BNP 839.0*    ProBNP (last 3 results) No results for input(s): PROBNP in the last 8760 hours.  CBG: No results for input(s): GLUCAP in the last 168 hours.  No results found for this or any previous visit (from the past 240 hour(s)).   Studies: No results found.  Scheduled Meds: . diltiazem  240 mg Oral Daily  . metoprolol tartrate  25 mg Oral BID  . sodium chloride  3 mL Intravenous Q12H    Continuous Infusions: . sodium chloride 75 mL/hr at 02/16/15 0728  . pantoprozole (PROTONIX) infusion 8 mg/hr (02/16/15 5974)    Assessment/Plan: 1 Acute GI bleed with blood loss anemia; Most likely  Upper GI source; Hgb is stable at 8.1 Continue IV Protonix On clear liquid diet EGD if ok by Cardiology 2 Abnormal LFT's; D/c Pravastatin Check u/s of liver  Ammonia <  9 3 Recent onset - A- Fib-On Cardizem and Metoprolol Awaiting result on ECHO 4 Acute Renal Failure; Se Creat improved to 1.39.Continue IV hydration. 5 Elevated Troponin; Probable demand ischemia- Awaiting cardiology input  6 Leukocytosis: Improving- u/a -ok 7 Physical Therapy   Code Status: DNR/DNI        William Tapia   02/16/2015, 8:16 AM  LOS: 3 days

## 2015-02-17 LAB — CBC WITH DIFFERENTIAL/PLATELET
BASOS ABS: 0 10*3/uL (ref 0–0.1)
Basophils Relative: 0 %
Eosinophils Absolute: 0.2 10*3/uL (ref 0–0.7)
Eosinophils Relative: 2 %
HCT: 25.3 % — ABNORMAL LOW (ref 40.0–52.0)
Hemoglobin: 8.1 g/dL — ABNORMAL LOW (ref 13.0–18.0)
LYMPHS PCT: 5 %
Lymphs Abs: 0.5 10*3/uL — ABNORMAL LOW (ref 1.0–3.6)
MCH: 30.1 pg (ref 26.0–34.0)
MCHC: 31.8 g/dL — ABNORMAL LOW (ref 32.0–36.0)
MCV: 94.4 fL (ref 80.0–100.0)
Monocytes Absolute: 1.2 10*3/uL — ABNORMAL HIGH (ref 0.2–1.0)
Monocytes Relative: 10 %
NEUTROS PCT: 83 %
Neutro Abs: 9.7 10*3/uL — ABNORMAL HIGH (ref 1.4–6.5)
Platelets: 154 10*3/uL (ref 150–440)
RBC: 2.68 MIL/uL — ABNORMAL LOW (ref 4.40–5.90)
RDW: 15.8 % — AB (ref 11.5–14.5)
WBC: 11.6 10*3/uL — AB (ref 3.8–10.6)

## 2015-02-17 LAB — COMPREHENSIVE METABOLIC PANEL
ALT: 1152 U/L — ABNORMAL HIGH (ref 17–63)
ANION GAP: 6 (ref 5–15)
AST: 720 U/L — ABNORMAL HIGH (ref 15–41)
Albumin: 2.7 g/dL — ABNORMAL LOW (ref 3.5–5.0)
Alkaline Phosphatase: 158 U/L — ABNORMAL HIGH (ref 38–126)
BILIRUBIN TOTAL: 1.9 mg/dL — AB (ref 0.3–1.2)
BUN: 25 mg/dL — ABNORMAL HIGH (ref 6–20)
CHLORIDE: 112 mmol/L — AB (ref 101–111)
CO2: 20 mmol/L — AB (ref 22–32)
CREATININE: 1.13 mg/dL (ref 0.61–1.24)
Calcium: 7.1 mg/dL — ABNORMAL LOW (ref 8.9–10.3)
GFR calc Af Amer: >60 mL/min (ref >60)
GFR, EST NON AFRICAN AMERICAN: 56 mL/min — AB (ref >60)
Glucose, Bld: 96 mg/dL (ref 65–99)
POTASSIUM: 3.4 mmol/L — AB (ref 3.5–5.1)
Sodium: 138 mmol/L (ref 135–145)
Total Protein: 5 g/dL — ABNORMAL LOW (ref 6.5–8.1)

## 2015-02-17 LAB — PROTIME-INR
INR: 1.51
Prothrombin Time: 18.4 seconds — ABNORMAL HIGH (ref 11.4–15.0)

## 2015-02-17 NOTE — Progress Notes (Signed)
[facility] Encounter Note  Patient: William Tapia / Admit Date: 02/13/2015 / Date of Encounter: 02/17/2015, 8:28 AM   Subjective: No evidence of bleeding abdominal pain shortness of breath or chest pain  Review of Systems: Positive for: Weakness Negative for: Vision change, hearing change, syncope, dizziness, nausea, vomiting,diarrhea, bloody stool, stomach pain, cough, congestion, diaphoresis, urinary frequency, urinary pain,skin lesions, skin rashes Others previously listed  Objective: Telemetry: Atrial fibrillation with controlled ventricular rate Physical Exam: Blood pressure 118/57, pulse 77, temperature 97.9 F (36.6 C), temperature source Oral, resp. rate 18, height 5\' 6"  (1.676 m), weight 152 lb 14.4 oz (69.355 kg), SpO2 98 %. Body mass index is 24.69 kg/(m^2). General: Well developed, well nourished, in no acute distress. Head: Normocephalic, atraumatic, sclera non-icteric, no xanthomas, nares are without discharge. Neck: No apparent masses Lungs: Normal respirations with no wheezes, no rhonchi, no rales , no crackles   Heart: Irregular rate and rhythm, normal S1 off S2, 2-3+ aortic murmur, no rub, no gallop, PMI is normal size and placement, carotid upstroke normal with bruit, jugular venous pressure normal Abdomen: Soft, non-tender, non-distended with normoactive bowel sounds. No hepatosplenomegaly. Abdominal aorta is normal size without bruit Extremities: Trace edema, no clubbing, no cyanosis, no ulcers,  Peripheral: 2+ radial, 2+ femoral, 2+ dorsal pedal pulses Neuro: Alert and oriented. Moves all extremities spontaneously. Psych:  Responds to questions appropriately with a normal affect.   Intake/Output Summary (Last 24 hours) at 02/17/15 0828 Last data filed at 02/17/15 0514  Gross per 24 hour  Intake   1200 ml  Output    350 ml  Net    850 ml    Inpatient Medications:  . diltiazem  240 mg Oral Daily  . metoprolol tartrate  25 mg Oral BID   . pantoprazole  40 mg Oral BID  . sodium chloride  3 mL Intravenous Q12H   Infusions:    Labs:  Recent Labs  02/16/15 0516 02/17/15 0443  NA 137 138  K 3.6 3.4*  CL 110 112*  CO2 21* 20*  GLUCOSE 102* 96  BUN 41* 25*  CREATININE 1.39* 1.13  CALCIUM 7.3* 7.1*    Recent Labs  02/16/15 0516 02/17/15 0443  AST 1106* 720*  ALT 1402* 1152*  ALKPHOS 187* 158*  BILITOT 1.7* 1.9*  PROT 5.2* 5.0*  ALBUMIN 2.9* 2.7*    Recent Labs  02/16/15 0516 02/17/15 0443  WBC 12.6* 11.6*  NEUTROABS 10.4* 9.7*  HGB 8.1* 8.1*  HCT 25.1* 25.3*  MCV 95.1 94.4  PLT 149* 154   No results for input(s): CKTOTAL, CKMB, TROPONINI in the last 72 hours. Invalid input(s): POCBNP No results for input(s): HGBA1C in the last 72 hours.   Weights: Filed Weights   02/15/15 0500 02/16/15 0500 02/17/15 0500  Weight: 155 lb 4.8 oz (70.444 kg) 148 lb 4.8 oz (67.268 kg) 152 lb 14.4 oz (69.355 kg)     Radiology/Studies:  Dg Chest 2 View  02/13/2015   CLINICAL DATA:  Increasing shortness of Breath  EXAM: CHEST  2 VIEW  COMPARISON:  None.  FINDINGS: Cardiac shadow is enlarged. The lungs are hyperinflated bilaterally consistent with COPD. No focal infiltrate or sizable effusion is noted. Degenerative change of the thoracic spine is seen. Mild aortic calcifications are noted.  IMPRESSION: COPD without acute abnormality.   Electronically Signed   By: Inez Catalina M.D.   On: 02/13/2015 16:57   Ct Head Wo Contrast  02/13/2015   CLINICAL DATA:  Headaches  EXAM: CT HEAD WITHOUT CONTRAST  TECHNIQUE: Contiguous axial images were obtained from the base of the skull through the vertex without intravenous contrast.  COMPARISON:  None.  FINDINGS: Bony calvarium is intact. Mild atrophic changes are noted. No findings to suggest acute hemorrhage, acute infarction or space-occupying mass lesion are noted.  IMPRESSION: Atrophic changes without acute abnormality.   Electronically Signed   By: Inez Catalina M.D.   On:  02/13/2015 17:10   US Abdomen Complete  02/16/2015   CLINICAL DATA:  Elevated LFTs, history chronic kidney disease, hypertension, hypercholesterolemia, former smoker  EXAM: ULTRASOUND ABDOMEN COMPLETE  COMPARISON:  None  FINDINGS: Gallbladder: Markedly thickened gallbladder wall with edema/striations within wall. No definite gallstones, pericholecystic fluid, or sonographic Murphy sign identified.  Common bile duct: Diameter: 4 mm diameter, normal  Liver: Upper normal echogenicity. Question bidirectional flow within portal vein. No focal hepatic mass or nodularity.  IVC: Distended IVC and hepatic veins question related RIGHT elevated heart pressure.  Pancreas: Normal appearance  Spleen: Normal appearance, 4.1 cm length  Right Kidney: Length: 10.9 cm. No definite solid mass or hydronephrosis. Probable cyst upper pole 18 x 12 x 14 mm, appears complicated on sagittal images. Mild cortical thinning with normal cortical echogenicity.  Left Kidney: Length: 11.8 cm. Normal morphology without mass or hydronephrosis.  Abdominal aorta: Normal caliber  Other findings: Small amount of ascites. BILATERAL pleural effusions.  IMPRESSION: BILATERAL pleural fusions and small amount of ascites.  Thickened gallbladder wall without definite stones or sonographic Murphy sign; this is a nonspecific finding in the setting of ascites.  Suspected bidirectional flow within the portal vein, which can be seen with portal hypertension, though this is not a dedicated hepatic vascular exam and waveforms were not obtained ; consider follow-up hepatic vascular ultrasound assessment.  Probable complicated cyst RIGHT kidney requiring further assessment to characterize ; recommend MR imaging with and without contrast to evaluate.   Electronically Signed   By: Lavonia Dana M.D.   On: 02/16/2015 11:45     Assessment and Recommendation  81 y.o. male with known chronic nonvalvular atrial fibrillation with controlled ventricular rate and GI bleed  secondary to use of anticoagulation with manifestations of shortness of breath improving at this time with essential hypertension chronic kidney disease stage III moderate aortic valve stenosis and LV systolic dysfunction by echocardiogram 1. Continue heart rate control with metoprolol and diltiazem for goal heart rate at rest of 60-90 bpm 2. No anticoagulation due to bleeding complications and GI bleed  3. Proceed to upper endoscopy and colonoscopy as necessary for further evaluation of treatment options 4. No further intervention of minimal elevation of troponin most consistent with demand ischemia of atrial fibrillation and anemia 5. Ambulation and follow for any further significant symptoms with adjustments of medication management as needed  Signed, Serafina Royals M.D. FACC

## 2015-02-17 NOTE — Evaluation (Signed)
Physical Therapy Evaluation Patient Details Name: William Tapia MRN: [id number] DOB: 1925-05-08 Today's Date: 02/17/2015   History of Present Illness  William Tapia is a 81 y.o. male with Afib recently started on Xarelto, HTN, Chronic back pain who is been having worsening shortness of breath over the past 6 days. He reports this shortness of breath is worse when he is up and walking.  Patient also reports back stools since starting xarelto. He was admitted with hemaglobin level of 6.6 and was found to have a GI bleed. Currently patient reports increased weakness with difficulty walking.   Clinical Impression  81 yo Male with GI bleed reports increased weakness and general decline in mobility over last week. Patient also reports shortness of breath on exertion. He had 2L O2 during evaluation and demonstrates good SPO2 levels following gait tasks despite shortness of breath. Patient reports being active and being modified independent in all ADLs prior to admittance. Currently he is mod I with bed mobility, supervision with sit<>Stand transfers and requires assistance with gait tasks. He is able to ambulate without AD, min A but demonstrates unsteady gait pattern with weaving and scissoring gait. PT instructed patient in gait with RW which demonstrates improved reciprocal gait pattern with less unsteadiness. He would benefit from additional skilled PT intervention to improve LE strength, balance/gait safety. PT recommends home health PT upon discharge and RW for home to address needs.    Follow Up Recommendations Home health PT    Equipment Recommendations  Rolling walker with 5" wheels    Recommendations for Other Services       Precautions / Restrictions Precautions Precautions: Fall Restrictions Weight Bearing Restrictions: No      Mobility  Bed Mobility Overal bed mobility: Modified Independent             General bed mobility comments: requires bed rail but is able to sit up  from supine mod I  Transfers Overall transfer level: Needs assistance Equipment used: Rolling walker (2 wheeled);None Transfers: Sit to/from Stand Sit to Stand: Supervision         General transfer comment: Transferred sit<>Stand from bed with and without RW, supervision with cues for hand placement and to increase forward lean for better transfer ability; also demonstrates wide base of support;   Ambulation/Gait Ambulation/Gait assistance: Min assist Ambulation Distance (Feet): 40 Feet Assistive device: None Gait Pattern/deviations: Step-through pattern;Decreased step length - left;Decreased stance time - right;Scissoring;Ataxic;Drifts right/left;Narrow base of support Gait velocity: decreased   General Gait Details: Ambulated without AD, requiring min A with unsteady gait pattern, weaving, narrow base of support and decreased gait speed; Treatment (8 min) pt ambulated with RW, 160 feet, CGA-supervision requiring min Vcs to increase step length,stay close to walker for safety and increase base of support for balance;   Stairs            Wheelchair Mobility    Modified Rankin (Stroke Patients Only)       Balance Overall balance assessment: Needs assistance             Standing balance comment: able to stand with feet apart, no AD unsupported against min pertubations; feet together eyes open, mild sway requiring close supervision, eyes closed: CGA with slight sway;                              Pertinent Vitals/Pain Pain Assessment: No/denies pain    Home Living Family/patient expects  to be discharged to:: Private residence Living Arrangements: Spouse/significant other Available Help at Discharge: Family Type of Home: House Home Access: Stairs to enter Entrance Stairs-Rails: Left Entrance Stairs-Number of Steps: 2-3 Home Layout: One level [place] - single point      Prior Function Level of Independence: Independent          Comments: Pt reports decline in last week prior to admittance requiring walking stick when walking around home. He reports still being independent in bathing/dressing;      Hand Dominance        Extremity/Trunk Assessment   Upper Extremity Assessment: Overall WFL for tasks assessed           Lower Extremity Assessment: Generalized weakness (BLE: hip grossly 3+/5, knee 4/5, ankle 3+/5)      Cervical / Trunk Assessment: Kyphotic  Communication   Communication: HOH  Cognition Arousal/Alertness: Awake/alert Behavior During Therapy: WFL for tasks assessed/performed Overall Cognitive Status: Within Functional Limits for tasks assessed (A&O for person, place and situation)                      General Comments      Exercises        Assessment/Plan    PT Assessment Patient needs continued PT services  PT Diagnosis Difficulty walking;Generalized weakness   PT Problem List Decreased strength;Decreased activity tolerance;Decreased safety awareness;Decreased balance;Decreased mobility  PT Treatment Interventions Balance training;Gait training;Neuromuscular re-education;Stair training;Functional mobility training;Patient/family education;Therapeutic activities;Therapeutic exercise   PT Goals (Current goals can be found in the Care Plan section) Acute Rehab PT Goals Patient Stated Goal: to go back home;  PT Goal Formulation: With patient/family Time For Goal Achievement: 03/03/15 Potential to Achieve Goals: Good    Frequency Min 2X/week   Barriers to discharge Other (comment) 2-3 steps to enter house; wife is available to assist with ADLs once home    Co-evaluation               End of Session Equipment Utilized During Treatment: Gait belt Activity Tolerance: Patient tolerated treatment well   Nurse Communication: Mobility status         Time: 1100-1125 PT Time Calculation (min) (ACUTE ONLY): 25 min   Charges:   PT Evaluation $Initial PT  Evaluation Tier I: 1 Procedure PT Treatments $Gait Training: 8-22 mins   PT G Codes:        Hopkins,Margaret 02/17/2015, 11:57 AM

## 2015-02-17 NOTE — Progress Notes (Signed)
Pt alert and oriented can be forgetful at times. Remaining on 2L of oxygen this shift. NO complaints of blood in stool this shift. No complaints of abdominal pain. Tolerating clear liquid diet. EGD pending lab work and cardiology work up.

## 2015-02-17 NOTE — Progress Notes (Signed)
Patient ID: ELDREDGE VELDHUIZEN, male   DOB: 10/18/1924, 81 y.o.   MRN: [id number]   PROGRESS NOTE  CASMIR AUGUSTE NWG:[id number] DOB: [birth year] DOA: 02/13/2015 PCP: Tracie Harrier, MD  HPI/Recap of past 24 hours: 81  Y/o male with hx of HTN, Recent onset A.Fib, Chronic back pain admitted with exertional shortness of breath, Melanotic stool . Hgb was 6.6 on admission Had been recently started on Xarelto for A-Fib Pt received 2 units of PRBC - Hgb is stable at 8.1 Abnormal LFT's noted. [facility] is trending down Renal function improving. Pt tolerating clear liquid diet   Consultants: GI and Cardiology   Objective: BP 134/74 mmHg  Pulse 71  Temp(Src) 97.9 F (36.6 C) (Oral)  Resp 18  Ht 5\' 6"  (1.676 m)  Wt 69.355 kg (152 lb 14.4 oz)  BMI 24.69 kg/m2  SpO2 98%  Intake/Output Summary (Last 24 hours) at 02/17/15 1054 Last data filed at 02/17/15 0800  Gross per 24 hour  Intake   1940 ml  Output    350 ml  Net   1590 ml   Filed Weights   02/15/15 0500 02/16/15 0500 02/17/15 0500  Weight: 70.444 kg (155 lb 4.8 oz) 67.268 kg (148 lb 4.8 oz) 69.355 kg (152 lb 14.4 oz)    Exam:   General:  Not in distress  Cardiovascular: S1 S2  Respiratory: Clear to auscultation  Abdomen: Soft. Non tender  Neuro:  .Non Focal  Data Reviewed: Basic Metabolic Panel:  Recent Labs Lab 02/13/15 1617 02/14/15 0735 02/15/15 0532 02/16/15 0516 02/17/15 0443  NA 135 133* 132* 137 138  K 5.2* 4.8 3.6 3.6 3.4*  CL 102 104 106 110 112*  CO2 12* 18* 17* 21* 20*  GLUCOSE 121* 110* 105* 102* 96  BUN 60* 76* 72* 41* 25*  CREATININE 2.34* 2.64* 2.27* 1.39* 1.13  CALCIUM 8.6* 8.0* 7.3* 7.3* 7.1*   Liver Function Tests:  Recent Labs Lab 02/15/15 1748 02/16/15 0516 02/17/15 0443  AST 1282* 1106* 720*  ALT 1436* 1402* 1152*  ALKPHOS 189* 187* 158*  BILITOT 1.7* 1.7* 1.9*  PROT 5.1* 5.2* 5.0*  ALBUMIN 2.8* 2.9* 2.7*   No results for input(s): LIPASE, AMYLASE in the last 168  hours.  Recent Labs Lab 02/15/15 1748  AMMONIA <9*   CBC:  Recent Labs Lab 02/13/15 1617 02/14/15 0735 02/15/15 0532 02/16/15 0516 02/17/15 0443  WBC 20.5* 20.6* 15.6* 12.6* 11.6*  NEUTROABS  --   --  13.1* 10.4* 9.7*  HGB 6.6* 8.1* 8.3* 8.1* 8.1*  HCT 20.9* 24.9* 25.2* 25.1* 25.3*  MCV 101.0* 95.8 94.7 95.1 94.4  PLT 269 215 177 149* 154   Cardiac Enzymes:    Recent Labs Lab 02/13/15 1617 02/13/15 1937 02/14/15 0107 02/14/15 0735  TROPONINI 0.40* 0.32* 0.42* 0.39*   BNP (last 3 results)  Recent Labs  02/13/15 1617  BNP 839.0*    ProBNP (last 3 results) No results for input(s): PROBNP in the last 8760 hours.  CBG: No results for input(s): GLUCAP in the last 168 hours.  Recent Results (from the past 240 hour(s))  Urine culture     Status: None (Preliminary result)   Collection Time: 02/15/15  2:01 PM  Result Value Ref Range Status   Specimen Description URINE, RANDOM  Final   Special Requests Normal  Final   Culture   Final    MULTIPLE SPECIES PRESENT, SUGGEST RECOLLECTION IF CLINICALLY INDICATED   Report Status PENDING  Incomplete     Studies:  US Abdomen Complete  02/16/2015   CLINICAL DATA:  Elevated LFTs, history chronic kidney disease, hypertension, hypercholesterolemia, former smoker  EXAM: ULTRASOUND ABDOMEN COMPLETE  COMPARISON:  None  FINDINGS: Gallbladder: Markedly thickened gallbladder wall with edema/striations within wall. No definite gallstones, pericholecystic fluid, or sonographic Murphy sign identified.  Common bile duct: Diameter: 4 mm diameter, normal  Liver: Upper normal echogenicity. Question bidirectional flow within portal vein. No focal hepatic mass or nodularity.  IVC: Distended IVC and hepatic veins question related RIGHT elevated heart pressure.  Pancreas: Normal appearance  Spleen: Normal appearance, 4.1 cm length  Right Kidney: Length: 10.9 cm. No definite solid mass or hydronephrosis. Probable cyst upper pole 18 x 12 x 14 mm,  appears complicated on sagittal images. Mild cortical thinning with normal cortical echogenicity.  Left Kidney: Length: 11.8 cm. Normal morphology without mass or hydronephrosis.  Abdominal aorta: Normal caliber  Other findings: Small amount of ascites. BILATERAL pleural effusions.  IMPRESSION: BILATERAL pleural fusions and small amount of ascites.  Thickened gallbladder wall without definite stones or sonographic Murphy sign; this is a nonspecific finding in the setting of ascites.  Suspected bidirectional flow within the portal vein, which can be seen with portal hypertension, though this is not a dedicated hepatic vascular exam and waveforms were not obtained ; consider follow-up hepatic vascular ultrasound assessment.  Probable complicated cyst RIGHT kidney requiring further assessment to characterize ; recommend MR imaging with and without contrast to evaluate.   Electronically Signed   By: Lavonia Dana M.D.   On: 02/16/2015 11:45    Scheduled Meds: . diltiazem  240 mg Oral Daily  . metoprolol tartrate  25 mg Oral BID  . pantoprazole  40 mg Oral BID  . sodium chloride  3 mL Intravenous Q12H    Continuous Infusions:    Assessment/Plan: 1 Acute GI bleed with blood loss anemia; Most likely  Upper GI source; GI follow up pending. Hgb is stable at 8.1 Continue IV Protonix On clear liquid diet Cardiology cleared for endoscopy if needed. 2 Abnormal LFT's;  Pravastatin DCed. u/s of liver suggests cirrhosis  Ammonia < 9 3 Recent onset - A- Fib-On Cardizem and Metoprolol 4 Acute Renal Failure; Continue IV hydration. 5 Elevated Troponin; Probable demand ischemia. 6 Leukocytosis: Improving- u/a -ok 7 Physical Therapy   Code Status: DNR/DNI        WALKER III, Jacari Iannello B   02/17/2015, 10:54 AM  LOS: 4 days

## 2015-02-17 NOTE — Plan of Care (Signed)
Problem: Discharge Progression Outcomes Goal: Other Discharge Outcomes/Goals Outcome: Progressing  Pt with gi bleed, no active bleeding noted.  Pt continues to tolerate  clear liquids per orders a.  Pt without c/o pain. IVF discontinued this shift per order.

## 2015-02-17 NOTE — Consult Note (Signed)
Subjective: Patient seen for melena, abnormal liver enzymes  Objective: Vital signs in last 24 hours: Temp:  [97.6 F (36.4 C)-98.7 F (37.1 C)] 97.6 F (36.4 C) (06/04 1227) Pulse Rate:  [67-88] 69 (06/04 1227) Resp:  [18] 18 (06/04 1227) BP: (114-134)/(57-74) 114/58 mmHg (06/04 1227) SpO2:  [96 %-99 %] 98 % (06/04 1227) Weight:  [69.355 kg (152 lb 14.4 oz)] 69.355 kg (152 lb 14.4 oz) (06/04 0500) Blood pressure 114/58, pulse 69, temperature 97.6 F (36.4 C), temperature source Oral, resp. rate 18, height 5\' 6"  (1.676 m), weight 69.355 kg (152 lb 14.4 oz), SpO2 98 %.   Intake/Output from previous day: 06/03 0701 - 06/04 0700 In: 1200 [I.V.:900; IV Piggyback:300] Out: 350 [Urine:350]  Intake/Output this shift: Total I/O In: 1480 [P.O.:1480] Out: -    General appearance:  Well-appearing 81 year old male no acute distress. Marked presbycusis Resp:  Clear to auscultation Cardio:  Regular rate and rhythm GI:  Soft nontender nondistended bowel sounds positive and normoactive Extremities:  No clubbing cyanosis or edema   Lab Results: Results for orders placed or performed during the hospital encounter of 02/13/15 (from the past 24 hour(s))  CBC with Differential/Platelet     Status: Abnormal   Collection Time: 02/17/15  4:43 AM  Result Value Ref Range   WBC 11.6 (H) 3.8 - 10.6 K/uL   RBC 2.68 (L) 4.40 - 5.90 MIL/uL   Hemoglobin 8.1 (L) 13.0 - 18.0 g/dL   HCT 25.3 (L) 40.0 - 52.0 %   MCV 94.4 80.0 - 100.0 fL   MCH 30.1 26.0 - 34.0 pg   MCHC 31.8 (L) 32.0 - 36.0 g/dL   RDW 15.8 (H) 11.5 - 14.5 %   Platelets 154 150 - 440 K/uL   Neutrophils Relative % 83 %   Neutro Abs 9.7 (H) 1.4 - 6.5 K/uL   Lymphocytes Relative 5 %   Lymphs Abs 0.5 (L) 1.0 - 3.6 K/uL   Monocytes Relative 10 %   Monocytes Absolute 1.2 (H) 0.2 - 1.0 K/uL   Eosinophils Relative 2 %   Eosinophils Absolute 0.2 0 - 0.7 K/uL   Basophils Relative 0 %   Basophils Absolute 0.0 0 - 0.1 K/uL  Comprehensive  metabolic panel     Status: Abnormal   Collection Time: 02/17/15  4:43 AM  Result Value Ref Range   Sodium 138 135 - 145 mmol/L   Potassium 3.4 (L) 3.5 - 5.1 mmol/L   Chloride 112 (H) 101 - 111 mmol/L   CO2 20 (L) 22 - 32 mmol/L   Glucose, Bld 96 65 - 99 mg/dL   BUN 25 (H) 6 - 20 mg/dL   Creatinine, Ser 1.13 0.61 - 1.24 mg/dL   Calcium 7.1 (L) 8.9 - 10.3 mg/dL   Total Protein 5.0 (L) 6.5 - 8.1 g/dL   Albumin 2.7 (L) 3.5 - 5.0 g/dL   AST 720 (H) 15 - 41 U/L   ALT 1152 (H) 17 - 63 U/L   Alkaline Phosphatase 158 (H) 38 - 126 U/L   Total Bilirubin 1.9 (H) 0.3 - 1.2 mg/dL   GFR calc non Af Amer 56 (L) >60 mL/min   GFR calc Af Amer >60 >60 mL/min   Anion gap 6 5 - 15  Protime-INR     Status: Abnormal   Collection Time: 02/17/15  4:43 AM  Result Value Ref Range   Prothrombin Time 18.4 (H) 11.4 - 15.0 seconds   INR 1.51  Recent Labs  02/15/15 0532 02/16/15 0516 02/17/15 0443  WBC 15.6* 12.6* 11.6*  HGB 8.3* 8.1* 8.1*  HCT 25.2* 25.1* 25.3*  PLT 177 149* 154   BMET  Recent Labs  02/15/15 0532 02/16/15 0516 02/17/15 0443  NA 132* 137 138  K 3.6 3.6 3.4*  CL 106 110 112*  CO2 17* 21* 20*  GLUCOSE 105* 102* 96  BUN 72* 41* 25*  CREATININE 2.27* 1.39* 1.13  CALCIUM 7.3* 7.3* 7.1*   LFT  Recent Labs  02/16/15 0516 02/17/15 0443  PROT 5.2* 5.0*  ALBUMIN 2.9* 2.7*  AST 1106* 720*  ALT 1402* 1152*  ALKPHOS 187* 158*  BILITOT 1.7* 1.9*  BILIDIR 0.7*  --   IBILI 1.0*  --    PT/INR  Recent Labs  02/16/15 0516 02/17/15 0443  LABPROT 20.9* 18.4*  INR 1.78 1.51   Hepatitis Panel No results for input(s): HEPBSAG, HCVAB, HEPAIGM, HEPBIGM in the last 72 hours. C-Diff No results for input(s): CDIFFTOX in the last 72 hours. No results for input(s): CDIFFPCR in the last 72 hours.   Studies/Results: US Abdomen Complete  02/16/2015   CLINICAL DATA:  Elevated LFTs, history chronic kidney disease, hypertension, hypercholesterolemia, former smoker  EXAM:  ULTRASOUND ABDOMEN COMPLETE  COMPARISON:  None  FINDINGS: Gallbladder: Markedly thickened gallbladder wall with edema/striations within wall. No definite gallstones, pericholecystic fluid, or sonographic Murphy sign identified.  Common bile duct: Diameter: 4 mm diameter, normal  Liver: Upper normal echogenicity. Question bidirectional flow within portal vein. No focal hepatic mass or nodularity.  IVC: Distended IVC and hepatic veins question related RIGHT elevated heart pressure.  Pancreas: Normal appearance  Spleen: Normal appearance, 4.1 cm length  Right Kidney: Length: 10.9 cm. No definite solid mass or hydronephrosis. Probable cyst upper pole 18 x 12 x 14 mm, appears complicated on sagittal images. Mild cortical thinning with normal cortical echogenicity.  Left Kidney: Length: 11.8 cm. Normal morphology without mass or hydronephrosis.  Abdominal aorta: Normal caliber  Other findings: Small amount of ascites. BILATERAL pleural effusions.  IMPRESSION: BILATERAL pleural fusions and small amount of ascites.  Thickened gallbladder wall without definite stones or sonographic Murphy sign; this is a nonspecific finding in the setting of ascites.  Suspected bidirectional flow within the portal vein, which can be seen with portal hypertension, though this is not a dedicated hepatic vascular exam and waveforms were not obtained ; consider follow-up hepatic vascular ultrasound assessment.  Probable complicated cyst RIGHT kidney requiring further assessment to characterize ; recommend MR imaging with and without contrast to evaluate.   Electronically Signed   By: Lavonia Dana M.D.   On: 02/16/2015 11:45    Scheduled Inpatient Medications:   . diltiazem  240 mg Oral Daily  . metoprolol tartrate  25 mg Oral BID  . pantoprazole  40 mg Oral BID  . sodium chloride  3 mL Intravenous Q12H    Continuous Inpatient Infusions:     PRN Inpatient Medications:  albuterol, ondansetron **OR** ondansetron (ZOFRAN)  IV  Miscellaneous:   Assessment:  1.melena. No further ongoing evidence of GI bleeding. Hemodynamically stable. Hemoglobin stable. 2. Liver enzymes elevated but improving. Laboratory still pending in regards to evaluation. 3. Her cardiology no further evaluation anticipated their standpoint. 4. EGD for Monday.  Plan:  #1. As noted above, continue current. Daily CBC and liver enzymes and PT.  Lollie Sails MD 02/17/2015, 4:40 PM

## 2015-02-17 NOTE — Plan of Care (Signed)
Problem: Discharge Progression Outcomes Goal: Other Discharge Outcomes/Goals Pt worked with PT today and tolerated moderately well, PT recommends home health and a walker, pt tolerating po clear liquids well, no n/v noted, no active bleeding noted.

## 2015-02-18 LAB — COMPREHENSIVE METABOLIC PANEL
ALT: 943 U/L — ABNORMAL HIGH (ref 17–63)
ANION GAP: 5 (ref 5–15)
AST: 434 U/L — ABNORMAL HIGH (ref 15–41)
Albumin: 2.7 g/dL — ABNORMAL LOW (ref 3.5–5.0)
Alkaline Phosphatase: 171 U/L — ABNORMAL HIGH (ref 38–126)
BUN: 20 mg/dL (ref 6–20)
CO2: 20 mmol/L — AB (ref 22–32)
Calcium: 7.1 mg/dL — ABNORMAL LOW (ref 8.9–10.3)
Chloride: 109 mmol/L (ref 101–111)
Creatinine, Ser: 1.06 mg/dL (ref 0.61–1.24)
GFR calc non Af Amer: >60 mL/min (ref >60)
GLUCOSE: 97 mg/dL (ref 65–99)
POTASSIUM: 3.5 mmol/L (ref 3.5–5.1)
Sodium: 134 mmol/L — ABNORMAL LOW (ref 135–145)
Total Bilirubin: 1.8 mg/dL — ABNORMAL HIGH (ref 0.3–1.2)
Total Protein: 5 g/dL — ABNORMAL LOW (ref 6.5–8.1)

## 2015-02-18 LAB — ANTI-SMOOTH MUSCLE ANTIBODY, IGG: F-ACTIN AB IGG: 17 U (ref 0–19)

## 2015-02-18 LAB — CBC WITH DIFFERENTIAL/PLATELET
BASOS PCT: 0 %
Basophils Absolute: 0 10*3/uL (ref 0–0.1)
Eosinophils Absolute: 0.3 10*3/uL (ref 0–0.7)
Eosinophils Relative: 3 %
HEMATOCRIT: 25.8 % — AB (ref 40.0–52.0)
Hemoglobin: 8.3 g/dL — ABNORMAL LOW (ref 13.0–18.0)
Lymphocytes Relative: 7 %
Lymphs Abs: 0.7 10*3/uL — ABNORMAL LOW (ref 1.0–3.6)
MCH: 30.4 pg (ref 26.0–34.0)
MCHC: 32.3 g/dL (ref 32.0–36.0)
MCV: 93.9 fL (ref 80.0–100.0)
Monocytes Absolute: 1.1 10*3/uL — ABNORMAL HIGH (ref 0.2–1.0)
Monocytes Relative: 11 %
NEUTROS PCT: 79 %
Neutro Abs: 8 10*3/uL — ABNORMAL HIGH (ref 1.4–6.5)
PLATELETS: 175 10*3/uL (ref 150–440)
RBC: 2.75 MIL/uL — ABNORMAL LOW (ref 4.40–5.90)
RDW: 15.9 % — ABNORMAL HIGH (ref 11.5–14.5)
WBC: 10 10*3/uL (ref 3.8–10.6)

## 2015-02-18 LAB — HEPATITIS PANEL, ACUTE
HEP A IGM: NEGATIVE — AB
Hep B C IgM: NEGATIVE — AB
Hepatitis B Surface Ag: NEGATIVE — AB

## 2015-02-18 LAB — PROTIME-INR
INR: 1.31
Prothrombin Time: 16.5 seconds — ABNORMAL HIGH (ref 11.4–15.0)

## 2015-02-18 LAB — ALPHA-1-ANTITRYPSIN: A-1 Antitrypsin, Ser: 246 mg/dL — ABNORMAL HIGH (ref 90–200)

## 2015-02-18 LAB — MITOCHONDRIAL ANTIBODIES: Mitochondrial M2 Ab, IgG: 5 Units (ref 0.0–20.0)

## 2015-02-18 NOTE — Progress Notes (Signed)
[facility] Encounter Note  Patient: William Tapia / Admit Date: 02/13/2015 / Date of Encounter: 02/18/2015, 7:56 AM   Subjective: No evidence of bleeding abdominal pain shortness of breath or chest pain and/or congestive heart failure despite some LV systolic dysfunction by echocardiogram and some aortic valve stenosis  Review of Systems: Positive for: Weakness Negative for: Vision change, hearing change, syncope, dizziness, nausea, vomiting,diarrhea, bloody stool, stomach pain, cough, congestion, diaphoresis, urinary frequency, urinary pain,skin lesions, skin rashes Others previously listed  Objective: Telemetry: Atrial fibrillation with controlled ventricular rate Physical Exam: Blood pressure 128/61, pulse 77, temperature 97.8 F (36.6 C), temperature source Oral, resp. rate 18, height 5\' 6"  (1.676 m), weight 155 lb (70.308 kg), SpO2 99 %. Body mass index is 25.03 kg/(m^2). General: Well developed, well nourished, in no acute distress. Head: Normocephalic, atraumatic, sclera non-icteric, no xanthomas, nares are without discharge. Neck: No apparent masses Lungs: Normal respirations with no wheezes, no rhonchi, no rales , no crackles   Heart: Irregular rate and rhythm, normal S1 off S2, 2-3+ aortic murmur, no rub, no gallop, PMI is normal size and placement, carotid upstroke normal with bruit, jugular venous pressure normal Abdomen: Soft, non-tender, non-distended with normoactive bowel sounds. No hepatosplenomegaly. Abdominal aorta is normal size without bruit Extremities: Trace edema, no clubbing, no cyanosis, no ulcers,  Peripheral: 2+ radial, 2+ femoral, 2+ dorsal pedal pulses Neuro: Alert and oriented. Moves all extremities spontaneously. Psych:  Responds to questions appropriately with a normal affect.   Intake/Output Summary (Last 24 hours) at 02/18/15 0756 Last data filed at 02/17/15 1700  Gross per 24 hour  Intake   1720 ml  Output      0 ml  Net    1720 ml    Inpatient Medications:  . diltiazem  240 mg Oral Daily  . metoprolol tartrate  25 mg Oral BID  . pantoprazole  40 mg Oral BID  . sodium chloride  3 mL Intravenous Q12H   Infusions:    Labs:  Recent Labs  02/17/15 0443 02/18/15 0426  NA 138 134*  K 3.4* 3.5  CL 112* 109  CO2 20* 20*  GLUCOSE 96 97  BUN 25* 20  CREATININE 1.13 1.06  CALCIUM 7.1* 7.1*    Recent Labs  02/17/15 0443 02/18/15 0426  AST 720* 434*  ALT 1152* 943*  ALKPHOS 158* 171*  BILITOT 1.9* 1.8*  PROT 5.0* 5.0*  ALBUMIN 2.7* 2.7*    Recent Labs  02/17/15 0443 02/18/15 0426  WBC 11.6* 10.0  NEUTROABS 9.7* 8.0*  HGB 8.1* 8.3*  HCT 25.3* 25.8*  MCV 94.4 93.9  PLT 154 175   No results for input(s): CKTOTAL, CKMB, TROPONINI in the last 72 hours. Invalid input(s): POCBNP No results for input(s): HGBA1C in the last 72 hours.   Weights: Filed Weights   02/16/15 0500 02/17/15 0500 02/18/15 0500  Weight: 148 lb 4.8 oz (67.268 kg) 152 lb 14.4 oz (69.355 kg) 155 lb (70.308 kg)     Radiology/Studies:  Dg Chest 2 View  02/13/2015   CLINICAL DATA:  Increasing shortness of Breath  EXAM: CHEST  2 VIEW  COMPARISON:  None.  FINDINGS: Cardiac shadow is enlarged. The lungs are hyperinflated bilaterally consistent with COPD. No focal infiltrate or sizable effusion is noted. Degenerative change of the thoracic spine is seen. Mild aortic calcifications are noted.  IMPRESSION: COPD without acute abnormality.   Electronically Signed   By: Inez Catalina M.D.   On: 02/13/2015 16:57  Ct Head Wo Contrast  02/13/2015   CLINICAL DATA:  Headaches  EXAM: CT HEAD WITHOUT CONTRAST  TECHNIQUE: Contiguous axial images were obtained from the base of the skull through the vertex without intravenous contrast.  COMPARISON:  None.  FINDINGS: Bony calvarium is intact. Mild atrophic changes are noted. No findings to suggest acute hemorrhage, acute infarction or space-occupying mass lesion are noted.  IMPRESSION: Atrophic  changes without acute abnormality.   Electronically Signed   By: Inez Catalina M.D.   On: 02/13/2015 17:10   US Abdomen Complete  02/16/2015   CLINICAL DATA:  Elevated LFTs, history chronic kidney disease, hypertension, hypercholesterolemia, former smoker  EXAM: ULTRASOUND ABDOMEN COMPLETE  COMPARISON:  None  FINDINGS: Gallbladder: Markedly thickened gallbladder wall with edema/striations within wall. No definite gallstones, pericholecystic fluid, or sonographic Murphy sign identified.  Common bile duct: Diameter: 4 mm diameter, normal  Liver: Upper normal echogenicity. Question bidirectional flow within portal vein. No focal hepatic mass or nodularity.  IVC: Distended IVC and hepatic veins question related RIGHT elevated heart pressure.  Pancreas: Normal appearance  Spleen: Normal appearance, 4.1 cm length  Right Kidney: Length: 10.9 cm. No definite solid mass or hydronephrosis. Probable cyst upper pole 18 x 12 x 14 mm, appears complicated on sagittal images. Mild cortical thinning with normal cortical echogenicity.  Left Kidney: Length: 11.8 cm. Normal morphology without mass or hydronephrosis.  Abdominal aorta: Normal caliber  Other findings: Small amount of ascites. BILATERAL pleural effusions.  IMPRESSION: BILATERAL pleural fusions and small amount of ascites.  Thickened gallbladder wall without definite stones or sonographic Murphy sign; this is a nonspecific finding in the setting of ascites.  Suspected bidirectional flow within the portal vein, which can be seen with portal hypertension, though this is not a dedicated hepatic vascular exam and waveforms were not obtained ; consider follow-up hepatic vascular ultrasound assessment.  Probable complicated cyst RIGHT kidney requiring further assessment to characterize ; recommend MR imaging with and without contrast to evaluate.   Electronically Signed   By: Lavonia Dana M.D.   On: 02/16/2015 11:45     Assessment and Recommendation  81 y.o. male with known  chronic nonvalvular atrial fibrillation with controlled ventricular rate and GI bleed secondary to use of anticoagulation with manifestations of shortness of breath improving at this time with essential hypertension chronic kidney disease stage III moderate aortic valve stenosis and LV systolic dysfunction by echocardiogram 1. Continue heart rate control with metoprolol and diltiazem for goal heart rate at rest of 60-90 bpm which appears to be stable at this time and appropriately treated 2. No anticoagulation due to bleeding complications and GI bleed  3. Proceed to upper endoscopy and colonoscopy as necessary for further evaluation of treatment options currently cardiovascular-wise stable 4. No further intervention of minimal elevation of troponin most consistent with demand ischemia of atrial fibrillation and anemia 5. Ambulation and follow for any further significant symptoms with adjustments of medication management as needed after upper endoscopy  Signed, Serafina Royals M.D. FACC

## 2015-02-18 NOTE — Consult Note (Signed)
Subjective: Patient seen for melena. Patient denies abdominal pain or nausea. He's had no bowel movement since admission. He is tolerating clear liquid diet.  Objective: Vital signs in last 24 hours: Temp:  [97.8 F (36.6 C)-98.4 F (36.9 C)] 97.8 F (36.6 C) (06/05 0547) Pulse Rate:  [75-127] 127 (06/05 1244) Resp:  [18] 18 (06/05 1244) BP: (112-128)/(58-61) 112/58 mmHg (06/05 1244) SpO2:  [94 %-100 %] 95 % (06/05 1330) Weight:  [70.308 kg (155 lb)] 70.308 kg (155 lb) (06/05 0500) Blood pressure 112/58, pulse 127, temperature 97.8 F (36.6 C), temperature source Oral, resp. rate 18, height 5\' 6"  (1.676 m), weight 70.308 kg (155 lb), SpO2 95 %.   Intake/Output from previous day: 06/04 0701 - 06/05 0700 In: 1720 [P.O.:1720] Out: -   Intake/Output this shift: Total I/O In: 1440 [P.O.:1440] Out: -    General appearance:  81 year old male no acute distress Resp:  Clear to auscultation Cardio:  Irregular rate and rhythm without rub or gallop GI:  Soft nontender nondistended bowel sounds positive normoactive Extremities:  No clubbing cyanosis or edema   Lab Results: Results for orders placed or performed during the hospital encounter of 02/13/15 (from the past 24 hour(s))  Comprehensive metabolic panel     Status: Abnormal   Collection Time: 02/18/15  4:26 AM  Result Value Ref Range   Sodium 134 (L) 135 - 145 mmol/L   Potassium 3.5 3.5 - 5.1 mmol/L   Chloride 109 101 - 111 mmol/L   CO2 20 (L) 22 - 32 mmol/L   Glucose, Bld 97 65 - 99 mg/dL   BUN 20 6 - 20 mg/dL   Creatinine, Ser 1.06 0.61 - 1.24 mg/dL   Calcium 7.1 (L) 8.9 - 10.3 mg/dL   Total Protein 5.0 (L) 6.5 - 8.1 g/dL   Albumin 2.7 (L) 3.5 - 5.0 g/dL   AST 434 (H) 15 - 41 U/L   ALT 943 (H) 17 - 63 U/L   Alkaline Phosphatase 171 (H) 38 - 126 U/L   Total Bilirubin 1.8 (H) 0.3 - 1.2 mg/dL   GFR calc non Af Amer >60 >60 mL/min   GFR calc Af Amer >60 >60 mL/min   Anion gap 5 5 - 15  CBC with Differential/Platelet      Status: Abnormal   Collection Time: 02/18/15  4:26 AM  Result Value Ref Range   WBC 10.0 3.8 - 10.6 K/uL   RBC 2.75 (L) 4.40 - 5.90 MIL/uL   Hemoglobin 8.3 (L) 13.0 - 18.0 g/dL   HCT 25.8 (L) 40.0 - 52.0 %   MCV 93.9 80.0 - 100.0 fL   MCH 30.4 26.0 - 34.0 pg   MCHC 32.3 32.0 - 36.0 g/dL   RDW 15.9 (H) 11.5 - 14.5 %   Platelets 175 150 - 440 K/uL   Neutrophils Relative % 79 %   Neutro Abs 8.0 (H) 1.4 - 6.5 K/uL   Lymphocytes Relative 7 %   Lymphs Abs 0.7 (L) 1.0 - 3.6 K/uL   Monocytes Relative 11 %   Monocytes Absolute 1.1 (H) 0.2 - 1.0 K/uL   Eosinophils Relative 3 %   Eosinophils Absolute 0.3 0 - 0.7 K/uL   Basophils Relative 0 %   Basophils Absolute 0.0 0 - 0.1 K/uL  Protime-INR     Status: Abnormal   Collection Time: 02/18/15  4:26 AM  Result Value Ref Range   Prothrombin Time 16.5 (H) 11.4 - 15.0 seconds   INR 1.31  Recent Labs  02/16/15 0516 02/17/15 0443 02/18/15 0426  WBC 12.6* 11.6* 10.0  HGB 8.1* 8.1* 8.3*  HCT 25.1* 25.3* 25.8*  PLT 149* 154 175   BMET  Recent Labs  02/16/15 0516 02/17/15 0443 02/18/15 0426  NA 137 138 134*  K 3.6 3.4* 3.5  CL 110 112* 109  CO2 21* 20* 20*  GLUCOSE 102* 96 97  BUN 41* 25* 20  CREATININE 1.39* 1.13 1.06  CALCIUM 7.3* 7.1* 7.1*   LFT  Recent Labs  02/16/15 0516  02/18/15 0426  PROT 5.2*  < > 5.0*  ALBUMIN 2.9*  < > 2.7*  AST 1106*  < > 434*  ALT 1402*  < > 943*  ALKPHOS 187*  < > 171*  BILITOT 1.7*  < > 1.8*  BILIDIR 0.7*  --   --   IBILI 1.0*  --   --   < > = values in this interval not displayed. PT/INR  Recent Labs  02/17/15 0443 02/18/15 0426  LABPROT 18.4* 16.5*  INR 1.51 1.31   Hepatitis Panel  Recent Labs  02/15/15 1927  HEPBSAG Negative*  HCVAB <0.1*  HEPAIGM Negative*  HEPBIGM Negative*   C-Diff No results for input(s): CDIFFTOX in the last 72 hours. No results for input(s): CDIFFPCR in the last 72 hours.   Studies/Results: No results found.  Scheduled Inpatient  Medications:   . diltiazem  240 mg Oral Daily  . metoprolol tartrate  25 mg Oral BID  . pantoprazole  40 mg Oral BID  . sodium chloride  3 mL Intravenous Q12H    Continuous Inpatient Infusions:     PRN Inpatient Medications:  albuterol, ondansetron **OR** ondansetron (ZOFRAN) IV  Miscellaneous:   Assessment:  1.melena. Hemodynamically stable no further episodes of bleeding. His elevated PT is improved. Her platelet count. 2. Abnormal liver enzymes likely related to hepatic ischemia from leading event. Anti-mitochondrial antibodies and anti-smooth muscle antibodies normal area and multiple other laboratory still pending in regards to etiology  Plan:  #1 EGD tomorrow. Will arrange this for Dr. Allen Norris. 2. A need to repeat ultrasound with Dopplers of the portal splenic and hepatic veins.  Lollie Sails MD 02/18/2015, 2:24 PM

## 2015-02-18 NOTE — Plan of Care (Signed)
Problem: Discharge Progression Outcomes Goal: Other Discharge Outcomes/Goals Outcome: Progressing Plan of care progress to goal for: Pain-no c/o of pain this shift Hemodynamically-VSS Complications-no c/o this shift Diet-pt tolerating diet this shift Activity-Pt is 1 assist but is generally weak

## 2015-02-18 NOTE — Plan of Care (Signed)
Problem: Discharge Progression Outcomes Goal: Other Discharge Outcomes/Goals Plan of Care Progress to Goal:  Pt ambulated in hall with walker and no 02 with sats 95-95 % on room air, o2 99% on room air at rest, no c/o noted, pt without s/s of active bleed

## 2015-02-18 NOTE — Progress Notes (Signed)
Patient ID: KEDRIC BUMGARNER, male   DOB: May 16, 1925, 81 y.o.   MRN: [id number] Patient ID: JAHSEH LUCCHESE, male   DOB: [birth year], 81 y.o.   MRN: [id number]   PROGRESS NOTE  CHRISTERPHER CLOS WPY:[id number] DOB: 08-31-1925 DOA: 02/13/2015 PCP: Tracie Harrier, MD  HPI/Recap of past 24 hours: 81  Y/o male with hx of HTN, Recent onset A.Fib, Chronic back pain admitted with exertional shortness of breath, Melanotic stool . Hgb was 6.6 on admission Had been recently started on Xarelto for A-Fib Pt received 2 units of PRBC - Hgb is stable . Abnormal LFT's noted. [place] is trending down Renal function improving. Pt tolerating clear liquid diet   Consultants: GI and Cardiology   Objective: BP 128/61 mmHg  Pulse 77  Temp(Src) 97.8 F (36.6 C) (Oral)  Resp 18  Ht 5\' 6"  (1.676 m)  Wt 70.308 kg (155 lb)  BMI 25.03 kg/m2  SpO2 98%  Intake/Output Summary (Last 24 hours) at 02/18/15 1026 Last data filed at 02/18/15 0900  Gross per 24 hour  Intake   1700 ml  Output      0 ml  Net   1700 ml   Filed Weights   02/16/15 0500 02/17/15 0500 02/18/15 0500  Weight: 67.268 kg (148 lb 4.8 oz) 69.355 kg (152 lb 14.4 oz) 70.308 kg (155 lb)    Exam:   General:  Not in distress  Cardiovascular: S1 S2  Respiratory: Clear to auscultation  Abdomen: Soft. Non tender  Neuro:  .Non Focal  Data Reviewed: Basic Metabolic Panel:  Recent Labs Lab 02/14/15 0735 02/15/15 0532 02/16/15 0516 02/17/15 0443 02/18/15 0426  NA 133* 132* 137 138 134*  K 4.8 3.6 3.6 3.4* 3.5  CL 104 106 110 112* 109  CO2 18* 17* 21* 20* 20*  GLUCOSE 110* 105* 102* 96 97  BUN 76* 72* 41* 25* 20  CREATININE 2.64* 2.27* 1.39* 1.13 1.06  CALCIUM 8.0* 7.3* 7.3* 7.1* 7.1*   Liver Function Tests:  Recent Labs Lab 02/15/15 1748 02/16/15 0516 02/17/15 0443 02/18/15 0426  AST 1282* 1106* 720* 434*  ALT 1436* 1402* 1152* 943*  ALKPHOS 189* 187* 158* 171*  BILITOT 1.7* 1.7* 1.9* 1.8*  PROT 5.1* 5.2* 5.0* 5.0*   ALBUMIN 2.8* 2.9* 2.7* 2.7*   No results for input(s): LIPASE, AMYLASE in the last 168 hours.  Recent Labs Lab 02/15/15 1748  AMMONIA <9*   CBC:  Recent Labs Lab 02/14/15 0735 02/15/15 0532 02/16/15 0516 02/17/15 0443 02/18/15 0426  WBC 20.6* 15.6* 12.6* 11.6* 10.0  NEUTROABS  --  13.1* 10.4* 9.7* 8.0*  HGB 8.1* 8.3* 8.1* 8.1* 8.3*  HCT 24.9* 25.2* 25.1* 25.3* 25.8*  MCV 95.8 94.7 95.1 94.4 93.9  PLT 215 177 149* 154 175   Cardiac Enzymes:    Recent Labs Lab 02/13/15 1617 02/13/15 1937 02/14/15 0107 02/14/15 0735  TROPONINI 0.40* 0.32* 0.42* 0.39*   BNP (last 3 results)  Recent Labs  02/13/15 1617  BNP 839.0*    ProBNP (last 3 results) No results for input(s): PROBNP in the last 8760 hours.  CBG: No results for input(s): GLUCAP in the last 168 hours.  Recent Results (from the past 240 hour(s))  Urine culture     Status: None (Preliminary result)   Collection Time: 02/15/15  2:01 PM  Result Value Ref Range Status   Specimen Description URINE, RANDOM  Final   Special Requests Normal  Final   Culture   Final    MULTIPLE  SPECIES PRESENT, SUGGEST RECOLLECTION IF CLINICALLY INDICATED   Report Status PENDING  Incomplete     Studies: No results found.  Scheduled Meds: . diltiazem  240 mg Oral Daily  . metoprolol tartrate  25 mg Oral BID  . pantoprazole  40 mg Oral BID  . sodium chloride  3 mL Intravenous Q12H    Continuous Infusions:    Assessment/Plan: 1 Acute GI bleed with blood loss anemia; Most likely  Upper GI source; GI plans upper endoscopy in AM Hgb is stable at 8.3. Continue  Protonix On clear liquid diet Cardiology cleared for endoscopy if needed. 2 Abnormal LFT's;  Pravastatin DCed. u/s of liver suggests cirrhosis  Ammonia < 9 3 Recent onset - A- Fib-On Cardizem and Metoprolol 4 Acute Renal Failure; Continue IV hydration. 5 Elevated Troponin; Probable demand ischemia. 6 Leukocytosis: Improving- u/a -ok 7 Physical  Therapy   Code Status: DNR/DNI        WALKER III, JOHN B   02/18/2015, 10:26 AM  LOS: 5 days

## 2015-02-19 ENCOUNTER — Encounter
Admission: EM | Disposition: A | Discharge status: Home / Self Care | Payer: Self-pay | Source: Home / Self Care | Attending: Internal Medicine

## 2015-02-19 DIAGNOSIS — K921 Melena: Secondary | ICD-10-CM

## 2015-02-19 DIAGNOSIS — R945 Abnormal results of liver function studies: Secondary | ICD-10-CM

## 2015-02-19 LAB — HEMOGLOBIN: Hemoglobin: 9.1 g/dL — ABNORMAL LOW (ref 13.0–18.0)

## 2015-02-19 SURGERY — ESOPHAGOGASTRODUODENOSCOPY (EGD) WITH PROPOFOL
Anesthesia: Monitor Anesthesia Care

## 2015-02-19 MED ORDER — BOOST / RESOURCE BREEZE PO LIQD
1.0000 | Freq: Three times a day (TID) | ORAL | Status: DC
  Administered 2015-02-19 – 2015-02-20 (×2): 1 via ORAL

## 2015-02-19 NOTE — Progress Notes (Signed)
Assumed care of patient at 0335. Pt resting with eyes closed, Off unit telemetry Afibb, HR 80's. Pt NPO for EGD scheduled today. Consent signed and on chart.

## 2015-02-19 NOTE — Progress Notes (Signed)
Physical Therapy Treatment Patient Details Name: William Tapia MRN: [id number] DOB: [birth year] Today's Date: 02/19/2015    History of Present Illness William Tapia is a 81 y.o. male with Afib recently started on Xarelto, HTN, Chronic back pain who is been having worsening shortness of breath over the past 6 days. He reports this shortness of breath is worse when he is up and walking.  Patient also reports back stools since starting xarelto. He was admitted with hemaglobin level of 6.6. Currently patient reports increased weakness with difficulty walking.     PT Comments    Pt demonstrates moderate to severe lateral gait deviations with head turns and is generally unstable without assistive device. However, once walker is introduced pt demonstrates good safety, stability, and balance. Pt scored 43/56 on BERG indicating significant increase in fall risk. He is able to complete stairs safely with therapist in order to demonstrate safe entry/exit of home. Pt will not be homebound at discharge and therefore will need OP PT for balance training. Pt currently not interested in PT but wife is in agreement that he needs therapy. Pt will benefit from skilled PT services to address deficits in strength, balance, and mobility in order to return to full function at home.    Follow Up Recommendations  Outpatient PT (For balance training. Pt currently refuses)     Equipment Recommendations   (Already has walker at home)    Recommendations for Other Services       Precautions / Restrictions Precautions Precautions: Fall Restrictions Weight Bearing Restrictions: No    Mobility  Bed Mobility Overal bed mobility: Modified Independent             General bed mobility comments: HOB flat and no bed rail. Good strength and sequencing  Transfers Overall transfer level: Needs assistance Equipment used: None Transfers: Sit to/from Stand Sit to Stand: Supervision         General transfer  comment: Pt is able to perform sit<>stand transfer without assistive device but is relatively unstable. Pt demonstrates improved safety with use of walker.  Ambulation/Gait Ambulation/Gait assistance: Min assist Ambulation Distance (Feet): 200 Feet Assistive device: Rolling walker (2 wheeled) Gait Pattern/deviations: Step-through pattern;Staggering left;Staggering right Gait velocity: WFL   General Gait Details: Pt ambulated intially without walker. Horizontal and vertical head turns reveal moderate to severe lateral gait deviations requiring therapist correction to maintain balance. Gait speed is functional for household ambulation. Modified DGI performed during gait and pt scored a 8/12 indicating increase risk for falls. Education provided to patient regarding use of rolling walker at home.    Stairs Stairs: Yes Stairs assistance: Min guard Stair Management: Alternating pattern;One rail Left (both hands on L rail) Number of Stairs: 6 General stair comments: Good safety and sequencing during stair ascend/descend. Pt requires two hands on the rail for safety.   Wheelchair Mobility    Modified Rankin (Stroke Patients Only)       Balance Overall balance assessment: Needs assistance Sitting-balance support: No upper extremity supported;Feet supported Sitting balance-Leahy Scale: Good     Standing balance support: No upper extremity supported Standing balance-Leahy Scale: Fair      BERG performed with patient: 83/56. Extensive education regarding safety with balance, need for further therapy services, and use of rolling walker for all ambulation. Use walking cane for ambulation across surfaces (grass) where walker will not function.                 Cognition Arousal/Alertness: Awake/alert Behavior  During Therapy: WFL for tasks assessed/performed Overall Cognitive Status: Within Functional Limits for tasks assessed (A&O for person, place and situation)                       Exercises General Exercises - Lower Extremity Long Arc Quad: 10 reps;Strengthening;Both;Seated Heel Slides: Strengthening;Both;10 reps;Seated Hip ABduction/ADduction: Both;15 reps;Seated;Strengthening Hip Flexion/Marching: Strengthening;Both;15 reps;Seated    General Comments        Pertinent Vitals/Pain      Home Living                      Prior Function            PT Goals (current goals can now be found in the care plan section) Acute Rehab PT Goals Patient Stated Goal: to go back home;  PT Goal Formulation: With patient/family Time For Goal Achievement: 03/03/15 Potential to Achieve Goals: Good Progress towards PT goals: Progressing toward goals    Frequency  Min 2X/week    PT Plan Current plan remains appropriate    Co-evaluation             End of Session Equipment Utilized During Treatment: Gait belt Activity Tolerance: Patient tolerated treatment well Patient left: in bed;with bed alarm set;with family/visitor present;with call bell/phone within reach     Time: 0835-0901 PT Time Calculation (min) (ACUTE ONLY): 26 min  Charges:  $Gait Training: 8-22 mins $Therapeutic Exercise: 8-22 mins                    G Codes:      Lyndel Safe Trevar Boehringer PT, DPT   Delonna Ney 02/19/2015, 10:22 AM

## 2015-02-19 NOTE — Care Management (Signed)
NPo. Scheduled for EGD today. Hgb 8.3 yesterday. Family at the bedside [place] [phone]

## 2015-02-19 NOTE — Progress Notes (Signed)
Nutrition Follow-up  INTERVENTION: Medical Food Supplement Therapy: will recommend Boost Breeze TID with meals for added nutrition (provides 250kcals and 9g protein in each supplement drink) Boost Breeze  NUTRITION DIAGNOSIS:  Inadequate oral intake related to inability to eat as evidenced by NPO status.  GOAL:   (Goal for diet advancement as medically able within 5-7 days)  MONITOR:   (Energy Intake, Digestive system, Anemia Profile, Anthropometrics, Electrolyte and renal Profile)  REASON FOR ASSESSMENT:   (Follow-Up)    ASSESSMENT:  Pt scheduled for EGD today, however procedure was cancelled and rescheduled for tomorrow. Pt s/p 2 units PRBC, Hgb stable, LFTs improving per MD note.  Current Nutrition: pt eating CL tray on visit, had eaten 100% of popsicle and most of broth on visit.  Pt reports eating most all of CL trays, however was NPO this am. Pt NPO/CL day 6.   Digestive system: last BM 5/31  Medications: Protonix Labs: Electrolyte and Renal Profile:  Recent Labs Lab 02/16/15 0516 02/17/15 0443 02/18/15 0426  BUN 41* 25* 20  CREATININE 1.39* 1.13 1.06  NA 137 138 134*  K 3.6 3.4* 3.5   Protein Profile:  Recent Labs Lab 02/16/15 0516 02/17/15 0443 02/18/15 0426  ALBUMIN 2.9* 2.7* 2.7*   Glucose Profile: No results for input(s): GLUCAP in the last 72 hours.  Hepatic Profile: Hepatic Function Latest Ref Rng 02/18/2015 02/17/2015 02/16/2015  Total Protein 6.5 - 8.1 g/dL 5.0(L) 5.0(L) 5.2(L)  Albumin 3.5 - 5.0 g/dL 2.7(L) 2.7(L) 2.9(L)  AST 15 - 41 U/L 434(H) 720(H) 1106(H)  ALT 17 - 63 U/L 943(H) 1152(H) 1402(H)  Alk Phosphatase 38 - 126 U/L 171(H) 158(H) 187(H)  Total Bilirubin 0.3 - 1.2 mg/dL 1.8(H) 1.9(H) 1.7(H)  Bilirubin, Direct 0.1 - 0.5 mg/dL - - 0.7(H)   Nutritional Anemia Profile:  CBC Latest Ref Rng 02/19/2015 02/18/2015 02/17/2015  WBC 3.8 - 10.6 K/uL - 10.0 11.6(H)  Hemoglobin 13.0 - 18.0 g/dL 9.1(L) 8.3(L) 8.1(L)  Hematocrit 40.0 - 52.0 % -  25.8(L) 25.3(L)  Platelets 150 - 440 K/uL - 175 154   Weight Trend since Admission: Filed Weights   02/17/15 0500 02/18/15 0500 02/19/15 0500  Weight: 152 lb 14.4 oz (69.355 kg) 155 lb (70.308 kg) 152 lb 8 oz (69.174 kg)   BMI:  Body mass index is 24.63 kg/(m^2).  Estimated Nutritional Needs:  Kcal:  1709-2109kcals, BEE: 1294kcals, TEE: (IF 1.1-1.3)(AF 1.2)   Protein:  68-82g protein (1.0-1.2g/kg)  Fluid:  1705-205mL of fluid (25-45mL/kg)  Skin:  Reviewed, no issues  Diet Order:  Diet clear liquid Room service appropriate?: Yes; Fluid consistency:: Thin  EDUCATION NEEDS:  No education needs identified at this time   Intake/Output Summary (Last 24 hours) at 02/19/15 1352 Last data filed at 02/18/15 1700  Gross per 24 hour  Intake    480 ml  Output      0 ml  Net    480 ml    HIGH Care Level  Dwyane Luo, RD, LDN Pager [phone]

## 2015-02-19 NOTE — Progress Notes (Addendum)
Subjective: Patient says he feels fine. Denies any further melena, nausea, vomiting, or abdominal pain.  LFTS/INR have significantly improved over weekend.  Objective: Vital signs in last 24 hours: Temp:  [97.8 F (36.6 C)-99 F (37.2 C)] 97.8 F (36.6 C) (06/06 0815) Pulse Rate:  [82-127] 84 (06/06 0815) Resp:  [18-20] 20 (06/06 0815) BP: (112-126)/(58-71) 123/68 mmHg (06/06 0815) SpO2:  [94 %-99 %] 99 % (06/06 0815) Weight:  [69.174 kg (152 lb 8 oz)] 69.174 kg (152 lb 8 oz) (06/06 0500) Last BM Date: 02/13/15 No LMP for male patient. Body mass index is 24.63 kg/(m^2). General:   Alert,  Well-developed, well-nourished, pleasant and cooperative in NAD Head:  Normocephalic and atraumatic. Eyes:  Sclera clear, no icterus.   Conjunctiva pink. Mouth:  No deformity or lesions, oropharynx pink & moist. Neck:  Supple; no masses or thyromegaly. Heart:  Regular rate and rhythm; no murmurs, clicks, rubs, or gallops. Abdomen:   Normal bowel sounds.  Soft, nontender and nondistended. No masses, hepatosplenomegaly or hernias noted.  No guarding or rebound tenderness.   Msk:  Symmetrical without gross deformities. Good equal movement & strength bilaterally. Pulses:  Normal pulses noted. Extremities:  Without clubbing or edema.  No cyanosis.  No asterixis. Neurologic:  Alert and  oriented x3, grossly normal neurologically Skin:  Intact without significant lesions or rashes. Cervical Nodes:  No significant cervical adenopathy. Psych:  Alert and cooperative.  Intake/Output from previous day: 06/05 0701 - 06/06 0700 In: 1920 [P.O.:1920] Out: -   Lab Results:  Recent Labs  02/17/15 0443 02/18/15 0426  WBC 11.6* 10.0  HGB 8.1* 8.3*  HCT 25.3* 25.8*  PLT 154 175   BMET  Recent Labs  02/17/15 0443 02/18/15 0426  NA 138 134*  K 3.4* 3.5  CL 112* 109  CO2 20* 20*  GLUCOSE 96 97  BUN 25* 20  CREATININE 1.13 1.06  CALCIUM 7.1* 7.1*   PT/INR  Recent Labs  02/17/15 0443  02/18/15 0426  LABPROT 18.4* 16.5*  INR 1.51 1.31   Hepatic Function Latest Ref Rng 02/18/2015 02/17/2015 02/16/2015  Total Protein 6.5 - 8.1 g/dL 5.0(L) 5.0(L) 5.2(L)  Albumin 3.5 - 5.0 g/dL 2.7(L) 2.7(L) 2.9(L)  AST 15 - 41 U/L 434(H) 720(H) 1106(H)  ALT 17 - 63 U/L 943(H) 1152(H) 1402(H)  Alk Phosphatase 38 - 126 U/L 171(H) 158(H) 187(H)  Total Bilirubin 0.3 - 1.2 mg/dL 1.8(H) 1.9(H) 1.7(H)  Bilirubin, Direct 0.1 - 0.5 mg/dL - - 0.7(H)    Studies/Results: No results found. Assessment: #1 Melena:  No melena today.  EGD tomorrow. I have discussed risks & benefits which include, but are not limited to, bleeding, infection, perforation & drug reaction.  The patient agrees with this plan & written consent will be obtained.   #2 Coagulopathy: INR less than 1.5 #3 Elevated LFTS:  Likely shock liver (ischemia).  ABD US shows changes of portal HTN.  Further hepatic serologic workup benign thus far.   #4 Iron deficiency anemia/ secondary to GI bleed: Hemoglobin stable status post transfusion  Plan: #1 Clear liquids, NPO after MN #2  await further hepatic labs pending  *[facility] will be for us covering over the weekend if needed     LOS: 6 days  Kandice Jones  02/19/2015, 11:01 AM [facility]  [address] [place], [place] [address] Phone: [phone] Fax : [phone]    

## 2015-02-19 NOTE — Plan of Care (Signed)
Problem: Discharge Progression Outcomes Goal: Other Discharge Outcomes/Goals Outcome: Progressing Plan of Care Progress to Goals: Hemo: VSS. No complaints of pain. No s/s of bleeding. Afebrile.  Tolerating Diet:  Pt remains on clear liquid diet. Pt has been NPO since MN for EGD later today.  Activity: Pt up in room with stand-by assist.

## 2015-02-19 NOTE — Plan of Care (Signed)
Problem: Discharge Progression Outcomes Goal: Other Discharge Outcomes/Goals Outcome: Progressing egd cancelled for today. Dr Allen Norris will performe tomorrow 6/7. No room available. Pt amb with p.t.  With walker and tol well.

## 2015-02-19 NOTE — Progress Notes (Signed)
Patient ID: MARSEL GAIL, male   DOB: 1924/12/13, 81 y.o.   MRN: [id number] Patient ID: JAVARIE CRISP, male   DOB: Feb 12, 1925, 81 y.o.   MRN: [id number]   PROGRESS NOTE  ALECZANDER FANDINO [id number] DOB: 02/15/1925 DOA: 02/13/2015 PCP: Tracie Harrier, MD  HPI/Recap of past 24 hours: 81  Y/o male with hx of HTN, Recent onset A.Fib, Chronic back pain admitted with exertional shortness of breath, Melanotic stool . Hgb was 6.6 on admission Had been recently started on Xarelto for A-Fib Pt received 2 units of PRBC - Hgb is stable ; 8.3 LFT's improving Pt is feeling better this am   Consultants: GI and Cardiology   Objective: BP 123/68 mmHg  Pulse 84  Temp(Src) 97.8 F (36.6 C) (Oral)  Resp 20  Ht 5\' 6"  (1.676 m)  Wt 69.174 kg (152 lb 8 oz)  BMI 24.63 kg/m2  SpO2 99%  Intake/Output Summary (Last 24 hours) at 02/19/15 0817 Last data filed at 02/18/15 1700  Gross per 24 hour  Intake   1920 ml  Output      0 ml  Net   1920 ml   Filed Weights   02/17/15 0500 02/18/15 0500 02/19/15 0500  Weight: 69.355 kg (152 lb 14.4 oz) 70.308 kg (155 lb) 69.174 kg (152 lb 8 oz)    Exam:   General:  Not in distress  Cardiovascular: S1 S2  Respiratory: Clear to auscultation  Abdomen: Soft. Non tender  Neuro:  .Non Focal  Data Reviewed: Basic Metabolic Panel:  Recent Labs Lab 02/14/15 0735 02/15/15 0532 02/16/15 0516 02/17/15 0443 02/18/15 0426  NA 133* 132* 137 138 134*  K 4.8 3.6 3.6 3.4* 3.5  CL 104 106 110 112* 109  CO2 18* 17* 21* 20* 20*  GLUCOSE 110* 105* 102* 96 97  BUN 76* 72* 41* 25* 20  CREATININE 2.64* 2.27* 1.39* 1.13 1.06  CALCIUM 8.0* 7.3* 7.3* 7.1* 7.1*   Liver Function Tests:  Recent Labs Lab 02/15/15 1748 02/16/15 0516 02/17/15 0443 02/18/15 0426  AST 1282* 1106* 720* 434*  ALT 1436* 1402* 1152* 943*  ALKPHOS 189* 187* 158* 171*  BILITOT 1.7* 1.7* 1.9* 1.8*  PROT 5.1* 5.2* 5.0* 5.0*  ALBUMIN 2.8* 2.9* 2.7* 2.7*   No results for  input(s): LIPASE, AMYLASE in the last 168 hours.  Recent Labs Lab 02/15/15 1748  AMMONIA <9*   CBC:  Recent Labs Lab 02/14/15 0735 02/15/15 0532 02/16/15 0516 02/17/15 0443 02/18/15 0426  WBC 20.6* 15.6* 12.6* 11.6* 10.0  NEUTROABS  --  13.1* 10.4* 9.7* 8.0*  HGB 8.1* 8.3* 8.1* 8.1* 8.3*  HCT 24.9* 25.2* 25.1* 25.3* 25.8*  MCV 95.8 94.7 95.1 94.4 93.9  PLT 215 177 149* 154 175   Cardiac Enzymes:    Recent Labs Lab 02/13/15 1617 02/13/15 1937 02/14/15 0107 02/14/15 0735  TROPONINI 0.40* 0.32* 0.42* 0.39*   BNP (last 3 results)  Recent Labs  02/13/15 1617  BNP 839.0*    ProBNP (last 3 results) No results for input(s): PROBNP in the last 8760 hours.  CBG: No results for input(s): GLUCAP in the last 168 hours.  Recent Results (from the past 240 hour(s))  Urine culture     Status: None (Preliminary result)   Collection Time: 02/15/15  2:01 PM  Result Value Ref Range Status   Specimen Description URINE, RANDOM  Final   Special Requests Normal  Final   Culture   Final    MULTIPLE SPECIES PRESENT, SUGGEST RECOLLECTION  IF CLINICALLY INDICATED   Report Status PENDING  Incomplete     Studies: No results found.  Scheduled Meds: . diltiazem  240 mg Oral Daily  . metoprolol tartrate  25 mg Oral BID  . pantoprazole  40 mg Oral BID  . sodium chloride  3 mL Intravenous Q12H    Continuous Infusions:    Assessment/Plan: 1 Acute GI bleed with blood loss anemia; Most likely  Upper GI source; EGD later today. Hgb is stable at 8.3. Continue  Protonix 2 Abnormal LFT's;  Pravastatin DC ed. Trending down ? Related to hepatic ischemia  3 Recent onset - A- Fib-On Cardizem and Metoprolol 4 Acute Renal Failure; Improved with hydration 5 Elevated Troponin; Probable demand ischemia. 6 Physical Therapy   Code Status: DNR/DNI        Erinn Mendosa   02/19/2015, 8:17 AM  LOS: 6 days

## 2015-02-20 ENCOUNTER — Encounter
Admission: EM | Disposition: A | Discharge status: Home / Self Care | Payer: Self-pay | Source: Home / Self Care | Attending: Internal Medicine

## 2015-02-20 ENCOUNTER — Inpatient Hospital Stay: Payer: Commercial Managed Care - HMO | Admitting: Anesthesiology

## 2015-02-20 ENCOUNTER — Telehealth: Payer: Self-pay | Admitting: Urgent Care

## 2015-02-20 DIAGNOSIS — K222 Esophageal obstruction: Secondary | ICD-10-CM

## 2015-02-20 DIAGNOSIS — D500 Iron deficiency anemia secondary to blood loss (chronic): Secondary | ICD-10-CM

## 2015-02-20 HISTORY — PX: ESOPHAGOGASTRODUODENOSCOPY (EGD) WITH PROPOFOL: SHX5813

## 2015-02-20 LAB — CBC WITH DIFFERENTIAL/PLATELET
BASOS ABS: 0 10*3/uL (ref 0–0.1)
BASOS PCT: 0 %
EOS ABS: 0.3 10*3/uL (ref 0–0.7)
Eosinophils Relative: 3 %
HEMATOCRIT: 26.4 % — AB (ref 40.0–52.0)
Hemoglobin: 8.5 g/dL — ABNORMAL LOW (ref 13.0–18.0)
LYMPHS ABS: 0.8 10*3/uL — AB (ref 1.0–3.6)
Lymphocytes Relative: 8 %
MCH: 29.8 pg (ref 26.0–34.0)
MCHC: 32.2 g/dL (ref 32.0–36.0)
MCV: 92.7 fL (ref 80.0–100.0)
Monocytes Absolute: 1 10*3/uL (ref 0.2–1.0)
Monocytes Relative: 11 %
Neutro Abs: 7.3 10*3/uL — ABNORMAL HIGH (ref 1.4–6.5)
Neutrophils Relative %: 78 %
PLATELETS: 257 10*3/uL (ref 150–440)
RBC: 2.85 MIL/uL — ABNORMAL LOW (ref 4.40–5.90)
RDW: 15.6 % — ABNORMAL HIGH (ref 11.5–14.5)
WBC: 9.5 10*3/uL (ref 3.8–10.6)

## 2015-02-20 LAB — BASIC METABOLIC PANEL
Anion gap: 5 (ref 5–15)
BUN: 10 mg/dL (ref 6–20)
CO2: 24 mmol/L (ref 22–32)
Calcium: 7.2 mg/dL — ABNORMAL LOW (ref 8.9–10.3)
Chloride: 109 mmol/L (ref 101–111)
Creatinine, Ser: 0.95 mg/dL (ref 0.61–1.24)
GFR calc Af Amer: >60 mL/min (ref >60)
GFR calc non Af Amer: >60 mL/min (ref >60)
Glucose, Bld: 95 mg/dL (ref 65–99)
POTASSIUM: 3.3 mmol/L — AB (ref 3.5–5.1)
SODIUM: 138 mmol/L (ref 135–145)

## 2015-02-20 SURGERY — ESOPHAGOGASTRODUODENOSCOPY (EGD) WITH PROPOFOL
Anesthesia: General

## 2015-02-20 MED ORDER — PROPOFOL INFUSION 10 MG/ML OPTIME
INTRAVENOUS | Status: DC | PRN
  Administered 2015-02-20: 50 ug/kg/min via INTRAVENOUS

## 2015-02-20 MED ORDER — SODIUM CHLORIDE 0.9 % IR SOLN: 1000.0000 mL | Status: DC

## 2015-02-20 MED ORDER — PHENYLEPHRINE HCL 10 MG/ML IJ SOLN
INTRAMUSCULAR | Status: DC | PRN
  Administered 2015-02-20: 100 ug via INTRAVENOUS

## 2015-02-20 MED ORDER — OMEPRAZOLE 20 MG PO CPDR: 20.0000 mg | DELAYED_RELEASE_CAPSULE | Freq: Every day | ORAL | Status: AC

## 2015-02-20 MED ORDER — LIDOCAINE HCL (CARDIAC) 20 MG/ML IV SOLN
INTRAVENOUS | Status: DC | PRN
  Administered 2015-02-20: 80 mg via INTRAVENOUS

## 2015-02-20 MED ORDER — SODIUM CHLORIDE 0.9 % IV SOLN
INTRAVENOUS | Status: DC
  Administered 2015-02-20: 09:00:00 via INTRAVENOUS

## 2015-02-20 MED ORDER — PROPOFOL 10 MG/ML IV BOLUS
INTRAVENOUS | Status: DC | PRN
  Administered 2015-02-20: 20 mg via INTRAVENOUS

## 2015-02-20 MED ORDER — CARTIA XT 240 MG PO CP24: 120.0000 mg | ORAL_CAPSULE | Freq: Every day | ORAL | Status: DC

## 2015-02-20 MED ORDER — FERROUS SULFATE 325 (65 FE) MG PO TABS: 325.0000 mg | ORAL_TABLET | Freq: Every day | ORAL | Status: AC

## 2015-02-20 MED ORDER — FENTANYL CITRATE (PF) 100 MCG/2ML IJ SOLN
INTRAMUSCULAR | Status: DC | PRN
  Administered 2015-02-20: 50 ug via INTRAVENOUS

## 2015-02-20 NOTE — Plan of Care (Signed)
Problem: Discharge Progression Outcomes Goal: Other Discharge Outcomes/Goals Outcome: Progressing Plan of care progress to goal: Hemodynamically stable: VSS this shift. NPO since MN for EGD today to be performed by Dr Allen Norris. Tolerating diet: Pt tolerating diet prior to NPO status after MN Activity appropriate: Pt working with Physical therapy. Up with assist. Uses standard walker.

## 2015-02-20 NOTE — Discharge Instructions (Signed)
Bloody Stools Bloody stools means there is blood in your poop (stool). It is a sign that there is a problem somewhere in the digestive system. It is important for your doctor to find the cause of your bleeding, so the problem can be treated.  HOME CARE  Only take medicine as told by your doctor.  Eat foods with fiber (prunes, bran cereals).  Drink enough fluids to keep your pee (urine) clear or pale yellow.  Sit in warm water (sitz bath) for 10 to 15 minutes as told by your doctor.  Know how to take your medicines (enemas, suppositories) if advised by your doctor.  Watch for signs that you are getting better or getting worse. GET HELP RIGHT AWAY IF:   You are not getting better.  You start to get better but then get worse again.  You have new problems.  You have severe bleeding from the place where poop comes out (rectum) that does not stop.  You throw up (vomit) blood.  You feel weak or pass out (faint).  You have a fever. MAKE SURE YOU:   Understand these instructions.  Will watch your condition.  Will get help right away if you are not doing well or get worse. Document Released: 08/20/2009 Document Revised: 11/24/2011 Document Reviewed: 01/17/2011 [facility] Patient Information 2015 [place], [place]. This information is not intended to replace advice given to you by your health care provider. Make sure you discuss any questions you have with your health care provider.  Gastrointestinal Bleeding Gastrointestinal bleeding is bleeding somewhere along the path that food travels through the body (digestive tract). This path is anywhere between the mouth and the opening of the butt (anus). You may have blood in your throw up (vomit) or in your poop (stools). If there is a lot of bleeding, you may need to stay in the hospital. [place]  Only take medicine as told by your doctor.  Eat foods with fiber such as whole grains, fruits, and vegetables. You can also try eating 1 to 3  prunes a day.  Drink enough fluids to keep your pee (urine) clear or pale yellow. GET HELP RIGHT AWAY IF:   Your bleeding gets worse.  You feel dizzy, weak, or you pass out (faint).  You have bad cramps in your back or belly (abdomen).  You have large blood clumps (clots) in your poop.  Your problems are getting worse. MAKE SURE YOU:   Understand these instructions.  Will watch your condition.  Will get help right away if you are not doing well or get worse. Document Released: 06/10/2008 Document Revised: 08/18/2012 Document Reviewed: 08/11/2011 [facility] Patient Information 2015 [place], [place]. This information is not intended to replace advice given to you by your health care provider. Make sure you discuss any questions you have with your health care provider.

## 2015-02-20 NOTE — Discharge Summary (Addendum)
Physician Discharge Summary  William Tapia ENI:[id number] DOB: 07/03/1925 DOA: 02/13/2015  PCP: Tracie Harrier, MD  Admit date: 02/13/2015 Discharge date: 02/20/2015  Time spent: 35  minutes  Recommendations for Outpatient Follow-up:  1. Follow up with Dr. Allen Norris- GI 2. Check cbc and Met-c and INR  in 1 week  3. Follow up with Dr. Ginette Pitman in 1-2 weeks   Discharge Diagnoses:  1 Acute GI bleed with blood loss anemia 2 Paroxysmal A-fib 3 Abnormal LFT's 4 Acute Renal failure- Improved    Discharge Condition: Stable    Filed Weights   02/18/15 0500 02/19/15 0500 02/20/15 0459  Weight: 70.308 kg (155 lb) 69.174 kg (152 lb 8 oz) 71.169 kg (156 lb 14.4 oz)    History of present illness:  William Tapia: Is an 81 year old male with A. Fib-recently started on Xarelto, History of Hypertension History of chronic back pain present the ED complaining of shortness of breath and generalized weakness. Patient also has been noticing black tarry stools one day after starting Xarelto. On Admission patient's Hemoglobin was noted to be  6.6   Hospital Course:  Patient was admitted to [facility] and received 2 units of packed red blood cells. His hemoglobin improved to 8.3 and remained stable at 8.5 on day of discharge. Patient was also evaluated by Dr. Nehemiah Massed cardiologist for elevated cardiac enzymes this was felt to be due to demand ischemia. His liver function tests were also abnormal with elevated AST and ALT of 720  and 1152 respectively. His Pravastatin was held and repeat liver function tests did show some improvement with AST of 434 ALT of 943. Patient underwent an EGD on 02/20/2015 by Dr. Allen Norris and this showed evidence for a benign-appearing stricture in the lower esophagus also evidence for a small hiatal hernia stomach and duodenum were normal. Patient resume normal diet and appeared stable at time of discharge he has been advised to follow-up with Dr. Allen Norris and also follow me  Dr. Ginette Pitman  in 1- 2 weeks' time. Patient will have a repeat CBC PT/INR and Metabolic C panel done in the next few days     Procedures: EGD  Consultations: Dr. Allen Norris- GI Dr. Erin Fulling- Cardiology  Discharge Exam: Filed Vitals:   02/20/15 1125  BP: 124/67  Pulse: 77  Temp: 97.6 F (36.4 C)  Resp: 18    General: Not in distress Cardiovascular: S1 S2 Respiratory: Clear to auscultation. Abdomen: Soft. Non tender   Discharge Instructions    Current Discharge Medication List    START taking these medications   Details  ferrous sulfate (FERROUSUL) 325 (65 FE) MG tablet Take 1 tablet (325 mg total) by mouth daily with breakfast. Qty: 30 tablet, Refills: 3    omeprazole (PRILOSEC) 20 MG capsule Take 1 capsule (20 mg total) by mouth daily. Qty: 30 capsule, Refills: 5      CONTINUE these medications   Details            Details  Coenzyme Q10 (COQ-10 PO) Take 1 tablet by mouth daily.    cyanocobalamin 1000 MCG tablet Take 1,000 mcg by mouth daily.         metoprolol tartrate (LOPRESSOR) 25 MG tablet Take 25 mg by mouth 2 (two) times daily.    VENTOLIN HFA 108 (90 BASE) MCG/ACT inhaler Take 2 puffs by mouth every 6 (six) hours as needed. For wheezing Refills: 5      STOP taking these medications     rivaroxaban (XARELTO) 20 MG TABS  tablet        No Known Allergies    The results of significant diagnostics from this hospitalization (including imaging, microbiology, ancillary and laboratory) are listed below for reference.    Significant Diagnostic Studies: Dg Chest 2 View  02/13/2015   CLINICAL DATA:  Increasing shortness of Breath  EXAM: CHEST  2 VIEW  COMPARISON:  None.  FINDINGS: Cardiac shadow is enlarged. The lungs are hyperinflated bilaterally consistent with COPD. No focal infiltrate or sizable effusion is noted. Degenerative change of the thoracic spine is seen. Mild aortic calcifications are noted.  IMPRESSION: COPD without acute abnormality.    Electronically Signed   By: Inez Catalina M.D.   On: 02/13/2015 16:57   Ct Head Wo Contrast  02/13/2015   CLINICAL DATA:  Headaches  EXAM: CT HEAD WITHOUT CONTRAST  TECHNIQUE: Contiguous axial images were obtained from the base of the skull through the vertex without intravenous contrast.  COMPARISON:  None.  FINDINGS: Bony calvarium is intact. Mild atrophic changes are noted. No findings to suggest acute hemorrhage, acute infarction or space-occupying mass lesion are noted.  IMPRESSION: Atrophic changes without acute abnormality.   Electronically Signed   By: Inez Catalina M.D.   On: 02/13/2015 17:10   US Abdomen Complete  02/16/2015   CLINICAL DATA:  Elevated LFTs, history chronic kidney disease, hypertension, hypercholesterolemia, former smoker  EXAM: ULTRASOUND ABDOMEN COMPLETE  COMPARISON:  None  FINDINGS: Gallbladder: Markedly thickened gallbladder wall with edema/striations within wall. No definite gallstones, pericholecystic fluid, or sonographic Murphy sign identified.  Common bile duct: Diameter: 4 mm diameter, normal  Liver: Upper normal echogenicity. Question bidirectional flow within portal vein. No focal hepatic mass or nodularity.  IVC: Distended IVC and hepatic veins question related RIGHT elevated heart pressure.  Pancreas: Normal appearance  Spleen: Normal appearance, 4.1 cm length  Right Kidney: Length: 10.9 cm. No definite solid mass or hydronephrosis. Probable cyst upper pole 18 x 12 x 14 mm, appears complicated on sagittal images. Mild cortical thinning with normal cortical echogenicity.  Left Kidney: Length: 11.8 cm. Normal morphology without mass or hydronephrosis.  Abdominal aorta: Normal caliber  Other findings: Small amount of ascites. BILATERAL pleural effusions.  IMPRESSION: BILATERAL pleural fusions and small amount of ascites.  Thickened gallbladder wall without definite stones or sonographic Murphy sign; this is a nonspecific finding in the setting of ascites.  Suspected  bidirectional flow within the portal vein, which can be seen with portal hypertension, though this is not a dedicated hepatic vascular exam and waveforms were not obtained ; consider follow-up hepatic vascular ultrasound assessment.  Probable complicated cyst RIGHT kidney requiring further assessment to characterize ; recommend MR imaging with and without contrast to evaluate.   Electronically Signed   By: Lavonia Dana M.D.   On: 02/16/2015 11:45    Microbiology: Recent Results (from the past 240 hour(s))  Urine culture     Status: None (Preliminary result)   Collection Time: 02/15/15  2:01 PM  Result Value Ref Range Status   Specimen Description URINE, RANDOM  Final   Special Requests Normal  Final   Culture   Final    MULTIPLE SPECIES PRESENT, SUGGEST RECOLLECTION IF CLINICALLY INDICATED   Report Status PENDING  Incomplete     Labs: Basic Metabolic Panel:  Recent Labs Lab 02/15/15 0532 02/16/15 0516 02/17/15 0443 02/18/15 0426 02/20/15 0517  NA 132* 137 138 134* 138  K 3.6 3.6 3.4* 3.5 3.3*  CL 106 110 112* 109 109  CO2 17* 21* 20* 20* 24  GLUCOSE 105* 102* 96 97 95  BUN 72* 41* 25* 20 10  CREATININE 2.27* 1.39* 1.13 1.06 0.95  CALCIUM 7.3* 7.3* 7.1* 7.1* 7.2*   Liver Function Tests:  Recent Labs Lab 02/15/15 1748 02/16/15 0516 02/17/15 0443 02/18/15 0426  AST 1282* 1106* 720* 434*  ALT 1436* 1402* 1152* 943*  ALKPHOS 189* 187* 158* 171*  BILITOT 1.7* 1.7* 1.9* 1.8*  PROT 5.1* 5.2* 5.0* 5.0*  ALBUMIN 2.8* 2.9* 2.7* 2.7*   No results for input(s): LIPASE, AMYLASE in the last 168 hours.  Recent Labs Lab 02/15/15 1748  AMMONIA <9*   CBC:  Recent Labs Lab 02/15/15 0532 02/16/15 0516 02/17/15 0443 02/18/15 0426 02/19/15 1119 02/20/15 0517  WBC 15.6* 12.6* 11.6* 10.0  --  9.5  NEUTROABS 13.1* 10.4* 9.7* 8.0*  --  7.3*  HGB 8.3* 8.1* 8.1* 8.3* 9.1* 8.5*  HCT 25.2* 25.1* 25.3* 25.8*  --  26.4*  MCV 94.7 95.1 94.4 93.9  --  92.7  PLT 177 149* 154 175  --   257   Cardiac Enzymes:  Recent Labs Lab 02/13/15 1617 02/13/15 1937 02/14/15 0107 02/14/15 0735  TROPONINI 0.40* 0.32* 0.42* 0.39*   BNP: BNP (last 3 results)  Recent Labs  02/13/15 1617  BNP 839.0*    ProBNP (last 3 results) No results for input(s): PROBNP in the last 8760 hours.  CBG: No results for input(s): GLUCAP in the last 168 hours.     SignedTracie Harrier   02/20/2015, 12:45 PM

## 2015-02-20 NOTE — Anesthesia Preprocedure Evaluation (Signed)
Anesthesia Evaluation  Patient identified by MRN, date of birth, ID band Patient awake    Reviewed: Allergy & Precautions, NPO status , Patient's Chart, lab work & pertinent test results  Airway Mallampati: III  TM Distance: >3 FB Neck ROM: Full    Dental   Pulmonary COPD COPD inhaler, former smoker (quit x 35 yrs),          Cardiovascular hypertension, Pt. on medications and Pt. on home beta blockers + CAD (demand ischemia with increased HR, Afib) and + DOE + dysrhythmias Atrial Fibrillation     Neuro/Psych    GI/Hepatic   Endo/Other    Renal/GU Renal InsufficiencyRenal disease     Musculoskeletal  (+) Arthritis - (polymyalgia rheumatica), Osteoarthritis,    Abdominal   Peds  Hematology  (+) anemia ,   Anesthesia Other Findings   Reproductive/Obstetrics                             Anesthesia Physical Anesthesia Plan  ASA: III  Anesthesia Plan: General   Post-op Pain Management:    Induction: Intravenous  Airway Management Planned: Nasal Cannula  Additional Equipment:   Intra-op Plan:   Post-operative Plan:   Informed Consent: I have reviewed the patients History and Physical, chart, labs and discussed the procedure including the risks, benefits and alternatives for the proposed anesthesia with the patient or authorized representative who has indicated his/her understanding and acceptance.     Plan Discussed with:   Anesthesia Plan Comments:         Anesthesia Quick Evaluation

## 2015-02-20 NOTE — Progress Notes (Signed)
Patient ID: William Tapia, male   DOB: 11/15/1924, 81 y.o.   MRN: [id number] Patient ID: William Tapia, male   DOB: [birth year], 81 y.o.   MRN: [id number]   PROGRESS NOTE  William Tapia [id number] DOB: Oct 19, 1924 DOA: 02/13/2015 PCP: William Harrier, MD  HPI/Recap of past 24 hours: 81  Y/o male with hx of HTN, Recent onset A.Fib, Chronic back pain admitted with exertional shortness of breath, Melanotic stool . Hgb was 6.6 on admission Had been recently started on Xarelto for A-Fib Pt received 2 units of PRBC - Hgb is stable ; 8.5 Pt feels well. Had normal BM   Consultants: GI and Cardiology   Objective: BP 141/55 mmHg  Pulse 86  Temp(Src) 98.2 F (36.8 C) (Oral)  Resp 20  Ht 5\' 6"  (1.676 m)  Wt 71.169 kg (156 lb 14.4 oz)  BMI 25.34 kg/m2  SpO2 100%  Intake/Output Summary (Last 24 hours) at 02/20/15 0818 Last data filed at 02/19/15 1700  Gross per 24 hour  Intake    960 ml  Output      0 ml  Net    960 ml   Filed Weights   02/18/15 0500 02/19/15 0500 02/20/15 0459  Weight: 70.308 kg (155 lb) 69.174 kg (152 lb 8 oz) 71.169 kg (156 lb 14.4 oz)    Exam:   General:  Not in distress  Cardiovascular: S1 S2  Respiratory: Clear to auscultation  Abdomen: Soft. Non tender  Neuro:  .Non Focal  Data Reviewed: Basic Metabolic Panel:  Recent Labs Lab 02/15/15 0532 02/16/15 0516 02/17/15 0443 02/18/15 0426 02/20/15 0517  NA 132* 137 138 134* 138  K 3.6 3.6 3.4* 3.5 3.3*  CL 106 110 112* 109 109  CO2 17* 21* 20* 20* 24  GLUCOSE 105* 102* 96 97 95  BUN 72* 41* 25* 20 10  CREATININE 2.27* 1.39* 1.13 1.06 0.95  CALCIUM 7.3* 7.3* 7.1* 7.1* 7.2*   Liver Function Tests:  Recent Labs Lab 02/15/15 1748 02/16/15 0516 02/17/15 0443 02/18/15 0426  AST 1282* 1106* 720* 434*  ALT 1436* 1402* 1152* 943*  ALKPHOS 189* 187* 158* 171*  BILITOT 1.7* 1.7* 1.9* 1.8*  PROT 5.1* 5.2* 5.0* 5.0*  ALBUMIN 2.8* 2.9* 2.7* 2.7*   No results for input(s): LIPASE,  AMYLASE in the last 168 hours.  Recent Labs Lab 02/15/15 1748  AMMONIA <9*   CBC:  Recent Labs Lab 02/15/15 0532 02/16/15 0516 02/17/15 0443 02/18/15 0426 02/19/15 1119 02/20/15 0517  WBC 15.6* 12.6* 11.6* 10.0  --  9.5  NEUTROABS 13.1* 10.4* 9.7* 8.0*  --  7.3*  HGB 8.3* 8.1* 8.1* 8.3* 9.1* 8.5*  HCT 25.2* 25.1* 25.3* 25.8*  --  26.4*  MCV 94.7 95.1 94.4 93.9  --  92.7  PLT 177 149* 154 175  --  257   Cardiac Enzymes:    Recent Labs Lab 02/13/15 1617 02/13/15 1937 02/14/15 0107 02/14/15 0735  TROPONINI 0.40* 0.32* 0.42* 0.39*   BNP (last 3 results)  Recent Labs  02/13/15 1617  BNP 839.0*    ProBNP (last 3 results) No results for input(s): PROBNP in the last 8760 hours.  CBG: No results for input(s): GLUCAP in the last 168 hours.  Recent Results (from the past 240 hour(s))  Urine culture     Status: None (Preliminary result)   Collection Time: 02/15/15  2:01 PM  Result Value Ref Range Status   Specimen Description URINE, RANDOM  Final   Special Requests Normal  Final   Culture   Final    MULTIPLE SPECIES PRESENT, SUGGEST RECOLLECTION IF CLINICALLY INDICATED   Report Status PENDING  Incomplete     Studies: No results found.  Scheduled Meds: . diltiazem  240 mg Oral Daily  . feeding supplement (RESOURCE BREEZE)  1 Container Oral TID WC  . metoprolol tartrate  25 mg Oral BID  . pantoprazole  40 mg Oral BID  . sodium chloride  3 mL Intravenous Q12H    Continuous Infusions:    Assessment/Plan: 1 Acute GI bleed with blood loss anemia; Most likely  Upper GI source;- For EGD this am Hgb is stable at 8.5. On  Protonix 2 Abnormal LFT's;  Pravastatin DC ed. Trending down ? Related to hepatic ischemia  3 Recent onset - A- Fib-On Cardizem and Metoprolol 4 Acute Renal Failure; Improved with hydration 5 Elevated Troponin; Probable demand ischemia. 6 Physical Therapy   Code Status: DNR/DNI        William Tapia   02/20/2015, 8:18 AM  LOS:  7 days

## 2015-02-20 NOTE — Progress Notes (Signed)
Pt being discharged home at this time, reviewed discharge and prescriptions with pt and wife, states understanding, pt tolerating regular diet, no noted complaints at discharge

## 2015-02-20 NOTE — Op Note (Signed)
[facility] Gastroenterology Patient Name: William Tapia Procedure Date: 02/20/2015 9:44 AM MRN: [id number] Account #: [id number] Date of Birth: Dec 30, 1924 Admit Type: Inpatient Age: 81 Room: [facility] ENDO ROOM 1 Gender: Male Note Status: Finalized Procedure:         Upper GI endoscopy Indications:       Iron deficiency anemia secondary to chronic blood loss Providers:         Lucilla Lame, MD Referring MD:      Tracie Harrier, MD (Referring MD) Medicines:         Propofol per Anesthesia Complications:     No immediate complications. Procedure:         Pre-Anesthesia Assessment:                    - Prior to the procedure, a History and Physical was                     performed, and patient medications and allergies were                     reviewed. The patient's tolerance of previous anesthesia                     was also reviewed. The risks and benefits of the procedure                     and the sedation options and risks were discussed with the                     patient. All questions were answered, and informed consent                     was obtained. Prior Anticoagulants: The patient has taken                     no previous anticoagulant or antiplatelet agents. ASA                     Grade Assessment: II - A patient with mild systemic                     disease. After reviewing the risks and benefits, the                     patient was deemed in satisfactory condition to undergo                     the procedure.                    After obtaining informed consent, the endoscope was passed                     under direct vision. Throughout the procedure, the                     patient's blood pressure, pulse, and oxygen saturations                     were monitored continuously. The Endoscope was introduced                     through the mouth, and advanced to the second part of  duodenum. The upper GI endoscopy was accomplished  without                     difficulty. The patient tolerated the procedure well. Findings:      A small hiatus hernia was present.      A benign-appearing, intrinsic mild stenosis was found at the       gastroesophageal junction and was traversed.      The entire examined stomach was normal.      The examined duodenum was normal. Impression:        - Small hiatus hernia.                    - Benign-appearing esophageal stricture.                    - Normal stomach.                    - Normal examined duodenum.                    - No specimens collected. Recommendation:    - Return patient to hospital ward. Procedure Code(s): --- Professional ---                    [phone], Esophagogastroduodenoscopy, flexible, transoral;                     diagnostic, including collection of specimen(s) by                     brushing or washing, when performed (separate procedure) Diagnosis Code(s): --- Professional ---                    D50.0, Iron deficiency anemia secondary to blood loss                     (chronic)                    K22.2, Esophageal obstruction CPT copyright 2014 American Medical [employer]. All rights reserved. The codes documented in this report are preliminary and upon coder review may  be revised to meet current compliance requirements. Lucilla Lame, MD 02/20/2015 10:07:34 AM This report has been signed electronically. Number of Addenda: 0 Note Initiated On: 02/20/2015 9:44 AM      [facility]

## 2015-02-20 NOTE — Telephone Encounter (Signed)
Pt needs FU OV for elevated LFTS & anemia next week with me

## 2015-02-20 NOTE — Transfer of Care (Signed)
Immediate Anesthesia Transfer of Care Note  Patient: William Tapia  Procedure(s) Performed: Procedure(s): ESOPHAGOGASTRODUODENOSCOPY (EGD) WITH PROPOFOL (N/A)  Patient Location: Endoscopy Unit  Anesthesia Type:General  Level of Consciousness: awake, alert  and oriented  Airway & Oxygen Therapy: Patient Spontanous Breathing and Patient connected to nasal cannula oxygen  Post-op Assessment: Report given to RN and Post -op Vital signs reviewed and stable  Post vital signs: Reviewed and stable  Last Vitals:  Filed Vitals:   02/20/15 1008  BP: 122/54  Pulse: 75  Temp:   Resp: 16    Complications: No apparent anesthesia complications

## 2015-02-20 NOTE — Anesthesia Postprocedure Evaluation (Signed)
  Anesthesia Post-op Note  Patient: William Tapia  Procedure(s) Performed: Procedure(s): ESOPHAGOGASTRODUODENOSCOPY (EGD) WITH PROPOFOL (N/A)  Anesthesia type:General  Patient location: PACU  Post pain: Pain level controlled  Post assessment: Post-op Vital signs reviewed, Patient's Cardiovascular Status Stable, Respiratory Function Stable, Patent Airway and No signs of Nausea or vomiting  Post vital signs: Reviewed and stable  Last Vitals:  Filed Vitals:   02/20/15 1020  BP: 119/71  Pulse: 76  Temp:   Resp: 9    Level of consciousness: awake, alert  and patient cooperative  Complications: No apparent anesthesia complications

## 2015-02-20 NOTE — Care Management (Signed)
Discharge to home today per Dr. Ginette Pitman. Declines services at this time. Family will transport Shelbie Ammons RN MSN Care Management [phone]

## 2015-02-22 ENCOUNTER — Encounter: Payer: Self-pay | Admitting: Gastroenterology

## 2015-02-26 ENCOUNTER — Ambulatory Visit (INDEPENDENT_AMBULATORY_CARE_PROVIDER_SITE_OTHER): Payer: Commercial Managed Care - HMO | Admitting: Urgent Care

## 2015-02-26 ENCOUNTER — Telehealth: Payer: Self-pay | Admitting: Urgent Care

## 2015-02-26 ENCOUNTER — Encounter: Payer: Self-pay | Admitting: Urgent Care

## 2015-02-26 ENCOUNTER — Other Ambulatory Visit
Admission: RE | Admit: 2015-02-26 | Discharge: 2015-02-26 | Disposition: A | Discharge status: Home / Self Care | Payer: Commercial Managed Care - HMO | Source: Ambulatory Visit | Attending: Urgent Care | Admitting: Urgent Care

## 2015-02-26 VITALS — BP 156/75 | HR 78 | Temp 97.6°F | Ht 64.0 in | Wt 154.0 lb

## 2015-02-26 DIAGNOSIS — K7030 Alcoholic cirrhosis of liver without ascites: Secondary | ICD-10-CM

## 2015-02-26 DIAGNOSIS — K7460 Unspecified cirrhosis of liver: Secondary | ICD-10-CM | POA: Insufficient documentation

## 2015-02-26 DIAGNOSIS — K921 Melena: Secondary | ICD-10-CM

## 2015-02-26 DIAGNOSIS — K2971 Gastritis, unspecified, with bleeding: Secondary | ICD-10-CM | POA: Diagnosis not present

## 2015-02-26 DIAGNOSIS — R7989 Other specified abnormal findings of blood chemistry: Secondary | ICD-10-CM | POA: Diagnosis not present

## 2015-02-26 LAB — CBC WITH DIFFERENTIAL/PLATELET
Basophils Absolute: 0.1 10*3/uL (ref 0–0.1)
Basophils Relative: 1 %
EOS ABS: 0.3 10*3/uL (ref 0–0.7)
Eosinophils Relative: 3 %
HCT: 32.2 % — ABNORMAL LOW (ref 40.0–52.0)
Hemoglobin: 10.2 g/dL — ABNORMAL LOW (ref 13.0–18.0)
LYMPHS PCT: 9 %
Lymphs Abs: 0.9 10*3/uL — ABNORMAL LOW (ref 1.0–3.6)
MCH: 29.9 pg (ref 26.0–34.0)
MCHC: 31.6 g/dL — ABNORMAL LOW (ref 32.0–36.0)
MCV: 94.6 fL (ref 80.0–100.0)
Monocytes Absolute: 1.1 10*3/uL — ABNORMAL HIGH (ref 0.2–1.0)
Monocytes Relative: 11 %
NEUTROS PCT: 76 %
Neutro Abs: 7.1 10*3/uL — ABNORMAL HIGH (ref 1.4–6.5)
PLATELETS: 420 10*3/uL (ref 150–440)
RBC: 3.4 MIL/uL — AB (ref 4.40–5.90)
RDW: 16 % — ABNORMAL HIGH (ref 11.5–14.5)
WBC: 9.4 10*3/uL (ref 3.8–10.6)

## 2015-02-26 LAB — HEPATIC FUNCTION PANEL
ALBUMIN: 3 g/dL — AB (ref 3.5–5.0)
ALK PHOS: 205 U/L — AB (ref 38–126)
ALT: 185 U/L — ABNORMAL HIGH (ref 17–63)
AST: 58 U/L — ABNORMAL HIGH (ref 15–41)
Bilirubin, Direct: 0.3 mg/dL (ref 0.1–0.5)
Indirect Bilirubin: 0.5 mg/dL (ref 0.3–0.9)
Total Bilirubin: 0.8 mg/dL (ref 0.3–1.2)
Total Protein: 6.1 g/dL — ABNORMAL LOW (ref 6.5–8.1)

## 2015-02-26 LAB — BASIC METABOLIC PANEL
ANION GAP: 5 (ref 5–15)
BUN: 16 mg/dL (ref 6–20)
CO2: 28 mmol/L (ref 22–32)
Calcium: 8.5 mg/dL — ABNORMAL LOW (ref 8.9–10.3)
Chloride: 108 mmol/L (ref 101–111)
Creatinine, Ser: 0.96 mg/dL (ref 0.61–1.24)
Glucose, Bld: 57 mg/dL — ABNORMAL LOW (ref 65–99)
Potassium: 4.2 mmol/L (ref 3.5–5.1)
Sodium: 141 mmol/L (ref 135–145)

## 2015-02-26 LAB — PROTIME-INR
INR: 0.96
Prothrombin Time: 13 seconds (ref 11.4–15.0)

## 2015-02-26 NOTE — Assessment & Plan Note (Signed)
EGD without evidence of bleeding or varices.

## 2015-02-26 NOTE — Telephone Encounter (Signed)
Please call [facility] lab.  We are missing AFP, ANA, ceruloplasmin & immunoglobulins that should be back by now. Thanks

## 2015-02-26 NOTE — Assessment & Plan Note (Signed)
William Tapia is a pleasant 81 y.o. 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  He also had recent decompensation and likely hepatic ischemia while hospitalized. He will have follow-up with cardiology. He is having anasarca with lower extremity edema. Discussed importance of low sodium diet. Handout was given. We'll check electrolytes and prescribed dieretic therapy with Lasix and Aldactone if patient is able to tolerate.  Hepatitis A/B vaccine status: Due for seen EGD to screen for esophageal varcies: No varices on EGD Abdominal ultrasound to screen for [facility]:  Due December 2016 Last AFP:   Pending

## 2015-02-26 NOTE — Progress Notes (Signed)
Primary Care Physician: Tracie Harrier, MD Primary Gastroenterologist:  Dr Allen Norris  Chief Complaint  Patient presents with  . Follow-up    ER Anemia & Elevated LFTS    HPI: William Tapia is a 81 y.o. male here for follow up of melena, ischemic hepatitis, & IDA.  While hospitalized he had coagulopathy.  INR less than 1.5 prior to normal EGD.  ABD US shows changes of portal HTN. Further hepatic serologic workup benign thus far.  He has had chronic ETOH use but has quit since hospitalization.  Last colonoscopy was over 10 yrs ago with Dr Vira Agar & pt reports polyps.  He has had a weight gain of 15# since hospitalization.  He sees Dr Ginette Pitman on Wednesday & will need FU with his cardiologist.    Current Outpatient Prescriptions  Medication Sig Dispense Refill  . Coenzyme Q10 (COQ-10 PO) Take 1 tablet by mouth daily.    Marland Kitchen diltiazem (TIAZAC) 240 MG 24 hr capsule Take 240 mg by mouth daily.    . ferrous sulfate (FERROUSUL) 325 (65 FE) MG tablet Take 1 tablet (325 mg total) by mouth daily with breakfast. 30 tablet 3  . metoprolol tartrate (LOPRESSOR) 25 MG tablet Take 25 mg by mouth 2 (two) times daily.    Marland Kitchen omeprazole (PRILOSEC) 20 MG capsule Take 1 capsule (20 mg total) by mouth daily. 30 capsule 5  . VENTOLIN HFA 108 (90 BASE) MCG/ACT inhaler Take 2 puffs by mouth every 6 (six) hours as needed. For wheezing  5  . cyanocobalamin 1000 MCG tablet Take 1,000 mcg by mouth daily.     No current facility-administered medications for this visit.    Allergies as of 02/26/2015  . (No Known Allergies)    Review of Systems: Gen: Denies any fever, chills, fatigue, weakness, malaise ENT: Negative for hoarseness, difficulty swallowing , nasal congestion CV: Denies chest pain, angina, palpitations, syncope, orthopnea, PND, and claudication. Resp: + Denies dyspnea with exercise.  Denies cough, sputum, wheezing, coughing up blood, and pleurisy. GI: See HPI GU:  Negative for dysuria, hematuria, urinary  incontinence, urinary frequency, nocturnal urination.  Endo: Negative for unusual weight change or sweats Derm: Denies jaundice, rash, itching, or unhealing ulcers.  Psych: Denies depression, anxiety, memory loss, suicidal ideation, hallucinations, paranoia, and confusion. Heme: Denies bruising, bleeding, and enlarged lymph nodes.   Physical Examination:  BP 156/75 mmHg  Pulse 78  Temp(Src) 97.6 F (36.4 C) (Oral)  Ht 5\' 4"  (1.626 m)  Wt 154 lb (69.854 kg)  BMI 26.42 kg/m2 Body mass index is 26.42 kg/(m^2). No LMP for male patient. General:   Alert,  Well-developed, well-nourished, pleasant and cooperative in NAD, accompanied by his wife Head:  Normocephalic and atraumatic. Eyes:  Sclera clear, no icterus.   Conjunctiva pink. Mouth:  No deformity or lesions.  Oropharynx pink & moist. Neck:  Supple; no masses or thyromegaly. Heart:  Regular rate and rhythm; no murmurs, clicks, rubs,  or gallops. Abdomen:   Normal bowel sounds.  Soft, nontender and nondistended. No masses.  +hepatosplenomegaly.  No hernias noted. No guarding or rebound tenderness.  No tense ascites Msk:  Symmetrical without gross deformities. Normal posture. Pulses:  Normal pulses noted. Extremities:  With clubbing.  2+ pedal edema. Neurologic:  Alert and  oriented x3;  grossly normal neurologically. Skin:  Intact without significant lesions or rashes. Cervical Nodes:  No significant cervical adenopathy. Psych:  Alert and cooperative. Normal mood and affect.  Imaging Studies: Dg Chest 2 View  02/13/2015  CLINICAL DATA:  Increasing shortness of Breath  EXAM: CHEST  2 VIEW  COMPARISON:  None.  FINDINGS: Cardiac shadow is enlarged. The lungs are hyperinflated bilaterally consistent with COPD. No focal infiltrate or sizable effusion is noted. Degenerative change of the thoracic spine is seen. Mild aortic calcifications are noted.  IMPRESSION: COPD without acute abnormality.   Electronically Signed   By: Inez Catalina M.D.    On: 02/13/2015 16:57   Ct Head Wo Contrast  02/13/2015   CLINICAL DATA:  Headaches  EXAM: CT HEAD WITHOUT CONTRAST  TECHNIQUE: Contiguous axial images were obtained from the base of the skull through the vertex without intravenous contrast.  COMPARISON:  None.  FINDINGS: Bony calvarium is intact. Mild atrophic changes are noted. No findings to suggest acute hemorrhage, acute infarction or space-occupying mass lesion are noted.  IMPRESSION: Atrophic changes without acute abnormality.   Electronically Signed   By: Inez Catalina M.D.   On: 02/13/2015 17:10   US Abdomen Complete  02/16/2015   CLINICAL DATA:  Elevated LFTs, history chronic kidney disease, hypertension, hypercholesterolemia, former smoker  EXAM: ULTRASOUND ABDOMEN COMPLETE  COMPARISON:  None  FINDINGS: Gallbladder: Markedly thickened gallbladder wall with edema/striations within wall. No definite gallstones, pericholecystic fluid, or sonographic Murphy sign identified.  Common bile duct: Diameter: 4 mm diameter, normal  Liver: Upper normal echogenicity. Question bidirectional flow within portal vein. No focal hepatic mass or nodularity.  IVC: Distended IVC and hepatic veins question related RIGHT elevated heart pressure.  Pancreas: Normal appearance  Spleen: Normal appearance, 4.1 cm length  Right Kidney: Length: 10.9 cm. No definite solid mass or hydronephrosis. Probable cyst upper pole 18 x 12 x 14 mm, appears complicated on sagittal images. Mild cortical thinning with normal cortical echogenicity.  Left Kidney: Length: 11.8 cm. Normal morphology without mass or hydronephrosis.  Abdominal aorta: Normal caliber  Other findings: Small amount of ascites. BILATERAL pleural effusions.  IMPRESSION: BILATERAL pleural fusions and small amount of ascites.  Thickened gallbladder wall without definite stones or sonographic Murphy sign; this is a nonspecific finding in the setting of ascites.  Suspected bidirectional flow within the portal vein, which can be  seen with portal hypertension, though this is not a dedicated hepatic vascular exam and waveforms were not obtained ; consider follow-up hepatic vascular ultrasound assessment.  Probable complicated cyst RIGHT kidney requiring further assessment to characterize ; recommend MR imaging with and without contrast to evaluate.   Electronically Signed   By: Lavonia Dana M.D.   On: 02/16/2015 11:45

## 2015-02-26 NOTE — Assessment & Plan Note (Deleted)
William Tapia is a pleasant 81 y.o. 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  He also had recent decompensation and likely hepatic ischemia while hospitalized. He is having anasarca with lower extremity edema. Discussed importance of low sodium diet. Handout was given. We'll check electrolytes and prescribed dieretic therapy with Lasix and Aldactone if patient is able to tolerate.  Hepatitis A/B vaccine status: Due for seen EGD to screen for esophageal varcies: No varices on EGD Abdominal ultrasound to screen for [facility]:  Due December 2016 Last AFP:   Pending

## 2015-02-26 NOTE — Patient Instructions (Signed)
Go to lab-we will call you with results & may start diuretics Cirrhosis Cirrhosis is a condition of scarring of the liver which is caused when the liver has tried repairing itself following damage. This damage may come from a previous infection such as one of the forms of hepatitis (usually hepatitis C), or the damage may come from being injured by toxins. The main toxin that causes this damage is alcohol. The scarring of the liver from use of alcohol is irreversible. That means the liver cannot return to normal even though alcohol is not used any more. The main danger of hepatitis C infection is that it may cause long-lasting (chronic) liver disease, and this also may lead to cirrhosis. This complication is progressive and irreversible. CAUSES  Prior to available blood tests, hepatitis C could be contracted by blood transfusions. Since testing of blood has improved, this is now unlikely. This infection can also be contracted through intravenous drug use and the sharing of needles. It can also be contracted through sexual relationships. The injury caused by alcohol comes from too much use. It is not a few drinks that poison the liver, but years of misuse. Usually there will be some signs and symptoms early with scarring of the liver that suggest the development of better habits. Alcohol should never be used while using acetaminophen. A small dose of both taken together may cause irreversible damage to the liver. HOME CARE INSTRUCTIONS  There is no specific treatment for cirrhosis. However, there are things you can do to avoid making the condition worse.  Rest as needed.  Eat a well-balanced diet. Your caregiver can help you with suggestions.  Vitamin supplements including vitamins A, K, D, and thiamine can help.  A low-salt diet, water restriction, or diuretic medicine may be needed to reduce fluid retention.  Avoid alcohol. This can be extremely toxic if combined with acetaminophen.  Avoid drugs  which are toxic to the liver. Some of these include isoniazid, methyldopa, acetaminophen, anabolic steroids (muscle-building drugs), erythromycin, and oral contraceptives (birth control pills). Check with your caregiver to make sure medicines you are presently taking will not be harmful.  Periodic blood tests may be required. Follow your caregiver's advice regarding the timing of these.  Milk thistle is an herbal remedy which does protect the liver against toxins. However, it will not help once the liver has been scarred. SEEK MEDICAL CARE IF:  You have increasing fatigue or weakness.  You develop swelling of the hands, feet, legs, or face.  You vomit bright red blood, or a coffee ground appearing material.  You have blood in your stools, or the stools turn black and tarry.  You have a fever.  You develop loss of appetite, or have nausea and vomiting.  You develop jaundice.  You develop easy bruising or bleeding.  You have worsening of any of the problems you are concerned about. Document Released: 09/01/2005 Document Revised: 11/24/2011 Document Reviewed: 04/19/2008 [facility] Patient Information 2015 [place], [place]. This information is not intended to replace advice given to you by your health care provider. Make sure you discuss any questions you have with your health care provider. Low-Sodium Eating Plan Sodium raises blood pressure and causes water to be held in the body. Getting less sodium from food will help lower your blood pressure, reduce any swelling, and protect your heart, liver, and kidneys. We get sodium by adding salt (sodium chloride) to food. Most of our sodium comes from canned, boxed, and frozen foods. Restaurant foods,  fast foods, and pizza are also very high in sodium. Even if you take medicine to lower your blood pressure or to reduce fluid in your body, getting less sodium from your food is important. WHAT IS MY PLAN? Most people should limit their sodium intake  to 2,300 mg a day. Your health care provider recommends that you limit your sodium intake to __________ a day.  WHAT DO I NEED TO KNOW ABOUT THIS EATING PLAN? For the low-sodium eating plan, you will follow these general guidelines:  Choose foods with a % Daily Value for sodium of less than 5% (as listed on the food label).   Use salt-free seasonings or herbs instead of table salt or sea salt.   Check with your health care provider or pharmacist before using salt substitutes.   Eat fresh foods.  Eat more vegetables and fruits.  Limit canned vegetables. If you do use them, rinse them well to decrease the sodium.   Limit cheese to 1 oz (28 g) per day.   Eat lower-sodium products, often labeled as "lower sodium" or "no salt added."  Avoid foods that contain monosodium glutamate (MSG). MSG is sometimes added to [place] food and some canned foods.  Check food labels (Nutrition Facts labels) on foods to learn how much sodium is in one serving.  Eat more home-cooked food and less restaurant, buffet, and fast food.  When eating at a restaurant, ask that your food be prepared with less salt or none, if possible.  HOW DO I READ FOOD LABELS FOR SODIUM INFORMATION? The Nutrition Facts label lists the amount of sodium in one serving of the food. If you eat more than one serving, you must multiply the listed amount of sodium by the number of servings. Food labels may also identify foods as:  Sodium free--Less than 5 mg in a serving.  Very low sodium--35 mg or less in a serving.  Low sodium--140 mg or less in a serving.  Light in sodium--50% less sodium in a serving. For example, if a food that usually has 300 mg of sodium is changed to become light in sodium, it will have 150 mg of sodium.  Reduced sodium--25% less sodium in a serving. For example, if a food that usually has 400 mg of sodium is changed to reduced sodium, it will have 300 mg of sodium. WHAT FOODS CAN I  EAT? Grains Low-sodium cereals, including oats, puffed wheat and rice, and shredded wheat cereals. Low-sodium crackers. Unsalted rice and pasta. Lower-sodium bread.  Vegetables Frozen or fresh vegetables. Low-sodium or reduced-sodium canned vegetables. Low-sodium or reduced-sodium tomato sauce and paste. Low-sodium or reduced-sodium tomato and vegetable juices.  Fruits Fresh, frozen, and canned fruit. Fruit juice.  Meat and Other Protein Products Low-sodium canned tuna and salmon. Fresh or frozen meat, poultry, seafood, and fish. Lamb. Unsalted nuts. Dried beans, peas, and lentils without added salt. Unsalted canned beans. Homemade soups without salt. Eggs.  Dairy Milk. Soy milk. Ricotta cheese. Low-sodium or reduced-sodium cheeses. Yogurt.  Condiments Fresh and dried herbs and spices. Salt-free seasonings. Onion and garlic powders. Low-sodium varieties of mustard and ketchup. Lemon juice.  Fats and Oils Reduced-sodium salad dressings. Unsalted butter.  Other Unsalted popcorn and pretzels.  The items listed above may not be a complete list of recommended foods or beverages. Contact your dietitian for more options. WHAT FOODS ARE NOT RECOMMENDED? Grains Instant hot cereals. Bread stuffing, pancake, and biscuit mixes. Croutons. Seasoned rice or pasta mixes. Noodle soup cups. Boxed or  frozen macaroni and cheese. Self-rising flour. Regular salted crackers. Vegetables Regular canned vegetables. Regular canned tomato sauce and paste. Regular tomato and vegetable juices. Frozen vegetables in sauces. Salted french fries. Olives. Angie Fava. Relishes. Sauerkraut. Salsa. Meat and Other Protein Products Salted, canned, smoked, spiced, or pickled meats, seafood, or fish. Bacon, ham, sausage, hot dogs, corned beef, chipped beef, and packaged luncheon meats. Salt pork. Jerky. Pickled herring. Anchovies, regular canned tuna, and sardines. Salted nuts. Dairy Processed cheese and cheese spreads.  Cheese curds. Blue cheese and cottage cheese. Buttermilk.  Condiments Onion and garlic salt, seasoned salt, table salt, and sea salt. Canned and packaged gravies. Worcestershire sauce. Tartar sauce. Barbecue sauce. Teriyaki sauce. Soy sauce, including reduced sodium. Steak sauce. Fish sauce. Oyster sauce. Cocktail sauce. Horseradish. Regular ketchup and mustard. Meat flavorings and tenderizers. Bouillon cubes. Hot sauce. Tabasco sauce. Marinades. Taco seasonings. Relishes. Fats and Oils Regular salad dressings. Salted butter. Margarine. Ghee. Bacon fat.  Other Potato and tortilla chips. Corn chips and puffs. Salted popcorn and pretzels. Canned or dried soups. Pizza. Frozen entrees and pot pies.  The items listed above may not be a complete list of foods and beverages to avoid. Contact your dietitian for more information. Document Released: 02/21/2002 Document Revised: 09/06/2013 Document Reviewed: 07/06/2013 [facility] Patient Information 2015 [place], [place]. This information is not intended to replace advice given to you by your health care provider. Make sure you discuss any questions you have with your health care provider.

## 2015-02-27 ENCOUNTER — Encounter: Payer: Self-pay | Admitting: Urgent Care

## 2015-02-27 NOTE — Telephone Encounter (Signed)
Spoke with Juliann Pulse at [facility] lab. Labs were never processed from hospitalization including AFP, ANA, ceruloplasmin, and immunoglobulins. They wil go to [facility] lab again tomorrow for redraw. Please fax orders to [facility] lab. Thanks

## 2015-02-27 NOTE — Telephone Encounter (Signed)
Labs were faxed to [facility] lab and Registration Desk. Recheck for results in 2 business days.

## 2015-02-28 ENCOUNTER — Other Ambulatory Visit
Admission: RE | Admit: 2015-02-28 | Discharge: 2015-02-28 | Disposition: A | Discharge status: Home / Self Care | Payer: Commercial Managed Care - HMO | Source: Ambulatory Visit | Attending: Urgent Care | Admitting: Urgent Care

## 2015-02-28 DIAGNOSIS — K7460 Unspecified cirrhosis of liver: Secondary | ICD-10-CM | POA: Diagnosis not present

## 2015-02-28 NOTE — Telephone Encounter (Signed)
A user error has taken place: encounter opened in error, closed for administrative reasons.

## 2015-02-28 NOTE — Telephone Encounter (Signed)
Pt states he was seen by Dr Ginette Pitman & had new diuretics ordered.  He has not picked them up.  He also says he wants to follow up with Dr Vira Agar as he is his friend.  He will call us back with medications & dosage.

## 2015-02-28 NOTE — Telephone Encounter (Signed)
Called patient's pharmacy ([facility]) and asked what diuretic was prescribed by Dr. Ginette Pitman. Pharmacist stated that patient will start taking Furosamide 40 MG 1 tab daily. Pt picked up his prescription this morning.

## 2015-03-01 ENCOUNTER — Encounter: Payer: Self-pay | Admitting: Urgent Care

## 2015-03-01 ENCOUNTER — Telehealth: Payer: Self-pay

## 2015-03-01 LAB — AFP TUMOR MARKER: AFP TUMOR MARKER: 6.4 ng/mL (ref 0.0–8.3)

## 2015-03-01 LAB — ANA COMPREHENSIVE PANEL
Anti JO-1: <0.2 AI (ref 0.0–0.9)
Centromere Ab Screen: <0.2 AI (ref 0.0–0.9)
Chromatin Ab SerPl-aCnc: <0.2 AI (ref 0.0–0.9)
ENA SM Ab Ser-aCnc: <0.2 AI (ref 0.0–0.9)
ds DNA Ab: <1 IU/mL (ref 0–9)

## 2015-03-01 LAB — CERULOPLASMIN: CERULOPLASMIN: 34.3 mg/dL — AB (ref 16.0–31.0)

## 2015-03-01 NOTE — Telephone Encounter (Signed)
-----   Message from Andria Meuse, NP sent at 03/01/2015  2:14 PM EDT ----- Please let pt know his labs he had redrawn look ok.  He should follow up with Dr Vira Agar within 2-3 weeks Thanks

## 2015-03-01 NOTE — Telephone Encounter (Signed)
Called patient and left a vm to return my call.

## 2015-03-01 NOTE — Telephone Encounter (Signed)
Patient called stating that he went to see Dr. Ginette Pitman today and he was told that his Liver enzymes were getting better. Patient also wanted you to know that he has not had any weight loss yet. He continues to take Lasix 40 mg 1 tab daily and Potassium Chloride 10 mEq daily. He was also told to follow up with Dr. Vira Agar within 2-3 weeks. Patient understood.

## 2015-03-02 LAB — URINE CULTURE: SPECIAL REQUESTS: NORMAL

## 2015-03-09 LAB — IMMUNOGLOBULINS A/E/G/M, SERUM
IgA: 314 mg/dL (ref 61–437)
IgE (Immunoglobulin E), Serum: 166 IU/mL — ABNORMAL HIGH (ref 0–100)
IgG (Immunoglobin G), Serum: 601 mg/dL — ABNORMAL LOW (ref 700–1600)
IgM, Serum: 50 mg/dL (ref 15–143)

## 2015-03-14 DIAGNOSIS — I482 Chronic atrial fibrillation: Secondary | ICD-10-CM | POA: Diagnosis not present

## 2015-03-14 DIAGNOSIS — I10 Essential (primary) hypertension: Secondary | ICD-10-CM | POA: Diagnosis not present

## 2015-03-14 DIAGNOSIS — E780 Pure hypercholesterolemia: Secondary | ICD-10-CM | POA: Diagnosis not present

## 2015-03-14 DIAGNOSIS — K922 Gastrointestinal hemorrhage, unspecified: Secondary | ICD-10-CM | POA: Diagnosis not present

## 2015-03-21 DIAGNOSIS — I4891 Unspecified atrial fibrillation: Secondary | ICD-10-CM | POA: Diagnosis not present

## 2015-03-21 DIAGNOSIS — R7989 Other specified abnormal findings of blood chemistry: Secondary | ICD-10-CM | POA: Diagnosis not present

## 2015-03-21 DIAGNOSIS — Z09 Encounter for follow-up examination after completed treatment for conditions other than malignant neoplasm: Secondary | ICD-10-CM | POA: Diagnosis not present

## 2015-03-21 DIAGNOSIS — M150 Primary generalized (osteo)arthritis: Secondary | ICD-10-CM | POA: Diagnosis not present

## 2015-03-29 DIAGNOSIS — I4891 Unspecified atrial fibrillation: Secondary | ICD-10-CM | POA: Diagnosis not present

## 2015-03-29 DIAGNOSIS — M150 Primary generalized (osteo)arthritis: Secondary | ICD-10-CM | POA: Diagnosis not present

## 2015-03-29 DIAGNOSIS — Z09 Encounter for follow-up examination after completed treatment for conditions other than malignant neoplasm: Secondary | ICD-10-CM | POA: Diagnosis not present

## 2015-03-29 DIAGNOSIS — R7989 Other specified abnormal findings of blood chemistry: Secondary | ICD-10-CM | POA: Diagnosis not present

## 2015-04-05 DIAGNOSIS — D500 Iron deficiency anemia secondary to blood loss (chronic): Secondary | ICD-10-CM | POA: Diagnosis not present

## 2015-04-05 DIAGNOSIS — I480 Paroxysmal atrial fibrillation: Secondary | ICD-10-CM | POA: Diagnosis not present

## 2015-04-05 DIAGNOSIS — R7989 Other specified abnormal findings of blood chemistry: Secondary | ICD-10-CM | POA: Diagnosis not present

## 2015-04-05 DIAGNOSIS — M353 Polymyalgia rheumatica: Secondary | ICD-10-CM | POA: Diagnosis not present

## 2015-05-01 DIAGNOSIS — K922 Gastrointestinal hemorrhage, unspecified: Secondary | ICD-10-CM | POA: Diagnosis not present

## 2015-05-14 DIAGNOSIS — E780 Pure hypercholesterolemia: Secondary | ICD-10-CM | POA: Diagnosis not present

## 2015-05-14 DIAGNOSIS — I4891 Unspecified atrial fibrillation: Secondary | ICD-10-CM | POA: Diagnosis not present

## 2015-05-14 DIAGNOSIS — I10 Essential (primary) hypertension: Secondary | ICD-10-CM | POA: Diagnosis not present

## 2015-05-14 DIAGNOSIS — I482 Chronic atrial fibrillation: Secondary | ICD-10-CM | POA: Diagnosis not present

## 2015-06-19 DIAGNOSIS — I480 Paroxysmal atrial fibrillation: Secondary | ICD-10-CM | POA: Diagnosis not present

## 2015-06-19 DIAGNOSIS — Z125 Encounter for screening for malignant neoplasm of prostate: Secondary | ICD-10-CM | POA: Diagnosis not present

## 2015-06-19 DIAGNOSIS — G8929 Other chronic pain: Secondary | ICD-10-CM | POA: Diagnosis not present

## 2015-06-19 DIAGNOSIS — M150 Primary generalized (osteo)arthritis: Secondary | ICD-10-CM | POA: Diagnosis not present

## 2015-06-19 DIAGNOSIS — I10 Essential (primary) hypertension: Secondary | ICD-10-CM | POA: Diagnosis not present

## 2015-06-19 DIAGNOSIS — M5442 Lumbago with sciatica, left side: Secondary | ICD-10-CM | POA: Diagnosis not present

## 2015-06-26 DIAGNOSIS — I480 Paroxysmal atrial fibrillation: Secondary | ICD-10-CM | POA: Diagnosis not present

## 2015-06-26 DIAGNOSIS — M150 Primary generalized (osteo)arthritis: Secondary | ICD-10-CM | POA: Diagnosis not present

## 2015-06-26 DIAGNOSIS — M5441 Lumbago with sciatica, right side: Secondary | ICD-10-CM | POA: Diagnosis not present

## 2015-06-26 DIAGNOSIS — Z23 Encounter for immunization: Secondary | ICD-10-CM | POA: Diagnosis not present

## 2015-06-26 DIAGNOSIS — I4891 Unspecified atrial fibrillation: Secondary | ICD-10-CM | POA: Diagnosis not present

## 2015-06-26 DIAGNOSIS — M5442 Lumbago with sciatica, left side: Secondary | ICD-10-CM | POA: Diagnosis not present

## 2015-06-26 DIAGNOSIS — D500 Iron deficiency anemia secondary to blood loss (chronic): Secondary | ICD-10-CM | POA: Diagnosis not present

## 2015-06-26 DIAGNOSIS — I10 Essential (primary) hypertension: Secondary | ICD-10-CM | POA: Diagnosis not present

## 2015-08-14 DIAGNOSIS — Z961 Presence of intraocular lens: Secondary | ICD-10-CM | POA: Diagnosis not present

## 2015-08-17 DIAGNOSIS — H109 Unspecified conjunctivitis: Secondary | ICD-10-CM | POA: Diagnosis not present

## 2015-10-22 DIAGNOSIS — M5441 Lumbago with sciatica, right side: Secondary | ICD-10-CM | POA: Diagnosis not present

## 2015-10-22 DIAGNOSIS — I480 Paroxysmal atrial fibrillation: Secondary | ICD-10-CM | POA: Diagnosis not present

## 2015-10-22 DIAGNOSIS — D500 Iron deficiency anemia secondary to blood loss (chronic): Secondary | ICD-10-CM | POA: Diagnosis not present

## 2015-10-22 DIAGNOSIS — G8929 Other chronic pain: Secondary | ICD-10-CM | POA: Diagnosis not present

## 2015-10-22 DIAGNOSIS — I4891 Unspecified atrial fibrillation: Secondary | ICD-10-CM | POA: Diagnosis not present

## 2015-10-22 DIAGNOSIS — M5442 Lumbago with sciatica, left side: Secondary | ICD-10-CM | POA: Diagnosis not present

## 2015-10-22 DIAGNOSIS — Z23 Encounter for immunization: Secondary | ICD-10-CM | POA: Diagnosis not present

## 2015-10-22 DIAGNOSIS — M150 Primary generalized (osteo)arthritis: Secondary | ICD-10-CM | POA: Diagnosis not present

## 2015-10-22 DIAGNOSIS — I10 Essential (primary) hypertension: Secondary | ICD-10-CM | POA: Diagnosis not present

## 2015-10-29 DIAGNOSIS — G629 Polyneuropathy, unspecified: Secondary | ICD-10-CM | POA: Diagnosis not present

## 2015-10-29 DIAGNOSIS — M150 Primary generalized (osteo)arthritis: Secondary | ICD-10-CM | POA: Diagnosis not present

## 2015-10-29 DIAGNOSIS — E7800 Pure hypercholesterolemia, unspecified: Secondary | ICD-10-CM | POA: Diagnosis not present

## 2015-10-29 DIAGNOSIS — I10 Essential (primary) hypertension: Secondary | ICD-10-CM | POA: Diagnosis not present

## 2015-10-29 DIAGNOSIS — I480 Paroxysmal atrial fibrillation: Secondary | ICD-10-CM | POA: Diagnosis not present

## 2016-01-14 DIAGNOSIS — I10 Essential (primary) hypertension: Secondary | ICD-10-CM | POA: Diagnosis not present

## 2016-01-14 DIAGNOSIS — I4891 Unspecified atrial fibrillation: Secondary | ICD-10-CM | POA: Diagnosis not present

## 2016-01-14 DIAGNOSIS — T2010XA Burn of first degree of head, face, and neck, unspecified site, initial encounter: Secondary | ICD-10-CM | POA: Diagnosis not present

## 2016-01-14 DIAGNOSIS — I480 Paroxysmal atrial fibrillation: Secondary | ICD-10-CM | POA: Diagnosis not present

## 2016-01-14 DIAGNOSIS — M150 Primary generalized (osteo)arthritis: Secondary | ICD-10-CM | POA: Diagnosis not present

## 2016-01-14 DIAGNOSIS — K922 Gastrointestinal hemorrhage, unspecified: Secondary | ICD-10-CM | POA: Diagnosis not present

## 2016-01-14 DIAGNOSIS — Z23 Encounter for immunization: Secondary | ICD-10-CM | POA: Diagnosis not present

## 2016-03-07 DIAGNOSIS — I10 Essential (primary) hypertension: Secondary | ICD-10-CM | POA: Diagnosis not present

## 2016-03-07 DIAGNOSIS — E7800 Pure hypercholesterolemia, unspecified: Secondary | ICD-10-CM | POA: Diagnosis not present

## 2016-03-07 DIAGNOSIS — M150 Primary generalized (osteo)arthritis: Secondary | ICD-10-CM | POA: Diagnosis not present

## 2016-03-07 DIAGNOSIS — G629 Polyneuropathy, unspecified: Secondary | ICD-10-CM | POA: Diagnosis not present

## 2016-03-07 DIAGNOSIS — I480 Paroxysmal atrial fibrillation: Secondary | ICD-10-CM | POA: Diagnosis not present

## 2016-03-14 DIAGNOSIS — M5442 Lumbago with sciatica, left side: Secondary | ICD-10-CM | POA: Diagnosis not present

## 2016-03-14 DIAGNOSIS — I480 Paroxysmal atrial fibrillation: Secondary | ICD-10-CM | POA: Diagnosis not present

## 2016-03-14 DIAGNOSIS — H539 Unspecified visual disturbance: Secondary | ICD-10-CM | POA: Diagnosis not present

## 2016-03-14 DIAGNOSIS — I10 Essential (primary) hypertension: Secondary | ICD-10-CM | POA: Diagnosis not present

## 2016-03-14 DIAGNOSIS — M5441 Lumbago with sciatica, right side: Secondary | ICD-10-CM | POA: Diagnosis not present

## 2016-03-14 DIAGNOSIS — M150 Primary generalized (osteo)arthritis: Secondary | ICD-10-CM | POA: Diagnosis not present

## 2016-03-14 DIAGNOSIS — R200 Anesthesia of skin: Secondary | ICD-10-CM | POA: Diagnosis not present

## 2016-03-14 DIAGNOSIS — N183 Chronic kidney disease, stage 3 (moderate): Secondary | ICD-10-CM | POA: Diagnosis not present

## 2016-03-14 DIAGNOSIS — Z0000 Encounter for general adult medical examination without abnormal findings: Secondary | ICD-10-CM | POA: Diagnosis not present

## 2016-04-11 DIAGNOSIS — H2511 Age-related nuclear cataract, right eye: Secondary | ICD-10-CM | POA: Diagnosis not present

## 2016-06-24 DIAGNOSIS — Z23 Encounter for immunization: Secondary | ICD-10-CM | POA: Diagnosis not present

## 2016-06-24 DIAGNOSIS — M353 Polymyalgia rheumatica: Secondary | ICD-10-CM | POA: Diagnosis not present

## 2016-06-24 DIAGNOSIS — I480 Paroxysmal atrial fibrillation: Secondary | ICD-10-CM | POA: Diagnosis not present

## 2016-06-24 DIAGNOSIS — M5416 Radiculopathy, lumbar region: Secondary | ICD-10-CM | POA: Diagnosis not present

## 2016-06-24 DIAGNOSIS — I10 Essential (primary) hypertension: Secondary | ICD-10-CM | POA: Diagnosis not present

## 2016-06-24 DIAGNOSIS — M150 Primary generalized (osteo)arthritis: Secondary | ICD-10-CM | POA: Diagnosis not present

## 2016-06-24 DIAGNOSIS — Z0000 Encounter for general adult medical examination without abnormal findings: Secondary | ICD-10-CM | POA: Diagnosis not present

## 2016-07-07 DIAGNOSIS — M5442 Lumbago with sciatica, left side: Secondary | ICD-10-CM | POA: Diagnosis not present

## 2016-07-07 DIAGNOSIS — R200 Anesthesia of skin: Secondary | ICD-10-CM | POA: Diagnosis not present

## 2016-07-07 DIAGNOSIS — M5441 Lumbago with sciatica, right side: Secondary | ICD-10-CM | POA: Diagnosis not present

## 2016-07-07 DIAGNOSIS — M150 Primary generalized (osteo)arthritis: Secondary | ICD-10-CM | POA: Diagnosis not present

## 2016-07-07 DIAGNOSIS — I480 Paroxysmal atrial fibrillation: Secondary | ICD-10-CM | POA: Diagnosis not present

## 2016-07-07 DIAGNOSIS — Z0000 Encounter for general adult medical examination without abnormal findings: Secondary | ICD-10-CM | POA: Diagnosis not present

## 2016-07-07 DIAGNOSIS — N183 Chronic kidney disease, stage 3 (moderate): Secondary | ICD-10-CM | POA: Diagnosis not present

## 2016-07-07 DIAGNOSIS — I10 Essential (primary) hypertension: Secondary | ICD-10-CM | POA: Diagnosis not present

## 2016-07-07 DIAGNOSIS — G8929 Other chronic pain: Secondary | ICD-10-CM | POA: Diagnosis not present

## 2016-07-14 DIAGNOSIS — I10 Essential (primary) hypertension: Secondary | ICD-10-CM | POA: Diagnosis not present

## 2016-07-14 DIAGNOSIS — G8929 Other chronic pain: Secondary | ICD-10-CM | POA: Diagnosis not present

## 2016-07-14 DIAGNOSIS — N183 Chronic kidney disease, stage 3 (moderate): Secondary | ICD-10-CM | POA: Diagnosis not present

## 2016-07-14 DIAGNOSIS — M5441 Lumbago with sciatica, right side: Secondary | ICD-10-CM | POA: Diagnosis not present

## 2016-07-14 DIAGNOSIS — I480 Paroxysmal atrial fibrillation: Secondary | ICD-10-CM | POA: Diagnosis not present

## 2016-07-14 DIAGNOSIS — D649 Anemia, unspecified: Secondary | ICD-10-CM | POA: Diagnosis not present

## 2016-07-14 DIAGNOSIS — E538 Deficiency of other specified B group vitamins: Secondary | ICD-10-CM | POA: Diagnosis not present

## 2016-08-10 IMAGING — DX DG CHEST 2V
2 series · 2 of 2 positions shown · non-contrast
Comparison: None.

CLINICAL DATA: Increasing shortness of Breath

EXAM:
CHEST  2 VIEW

[chest pa]
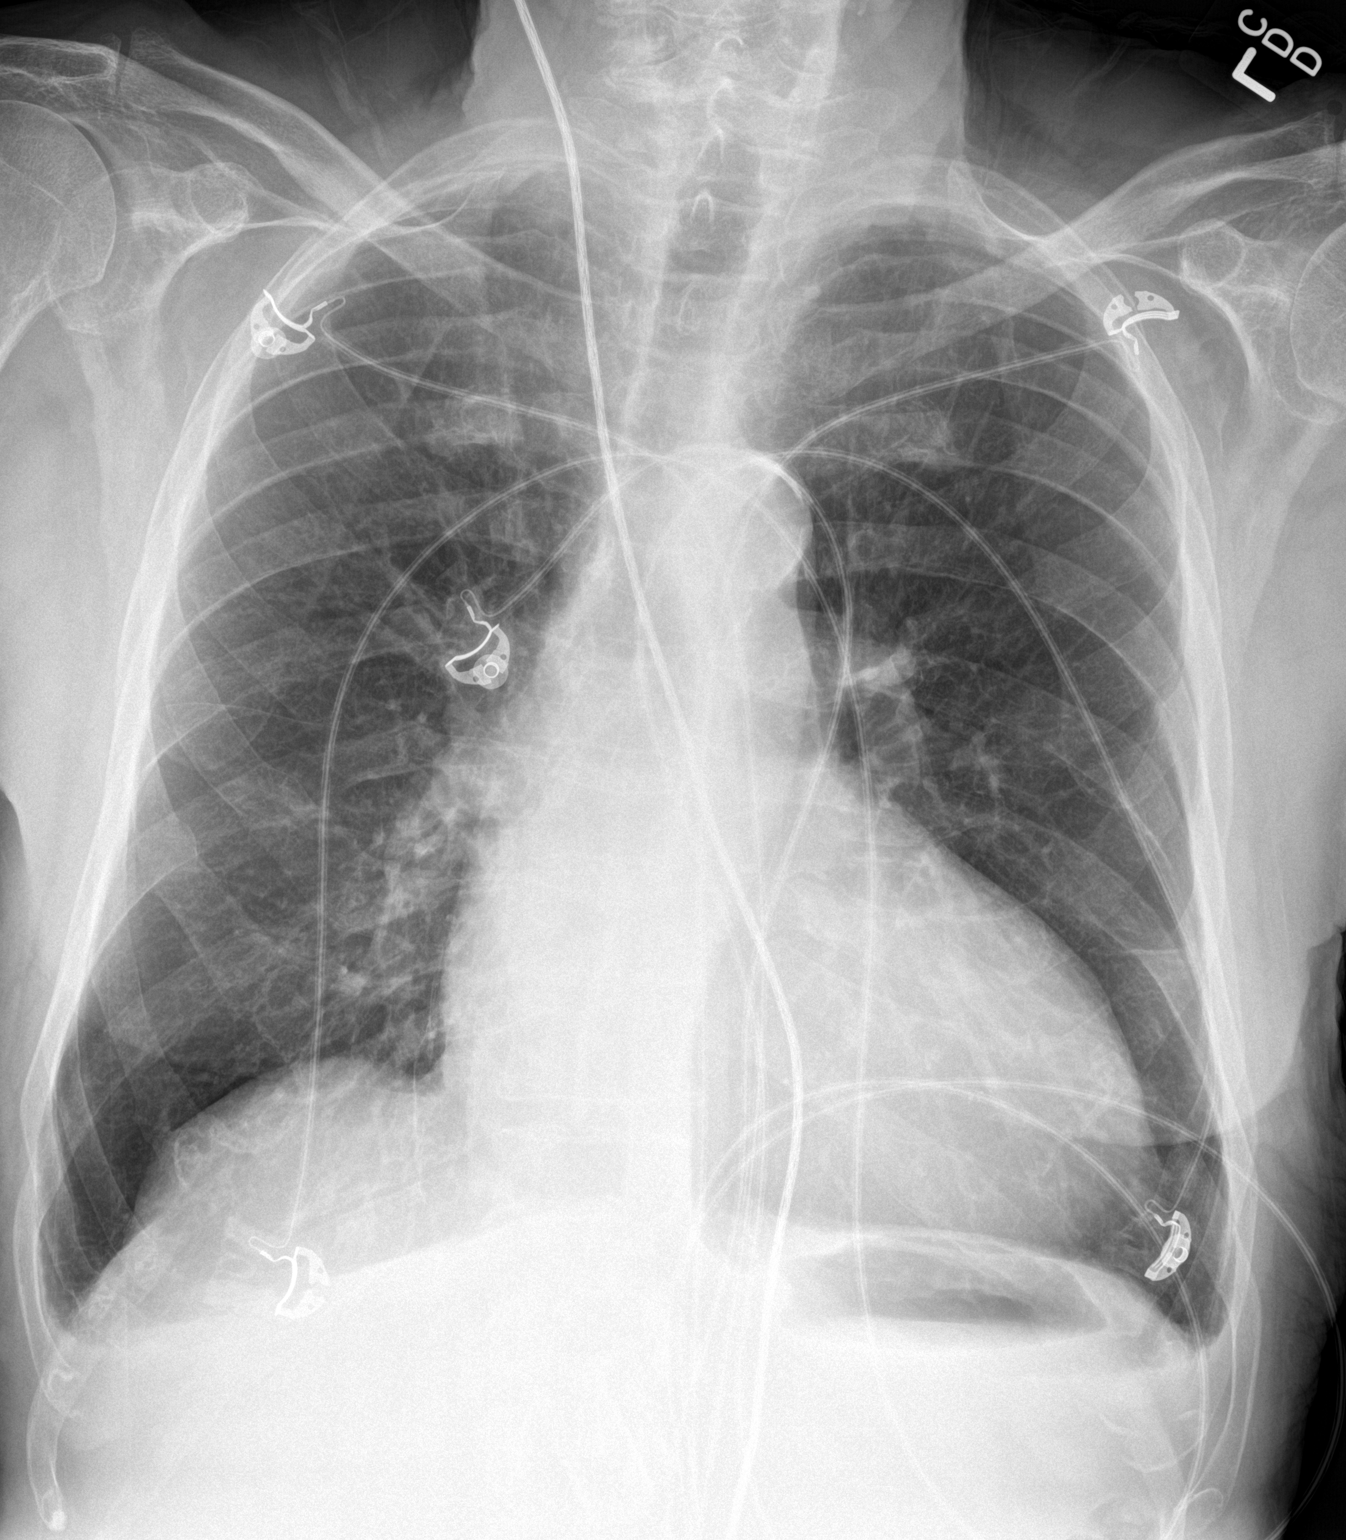

[chest lat]
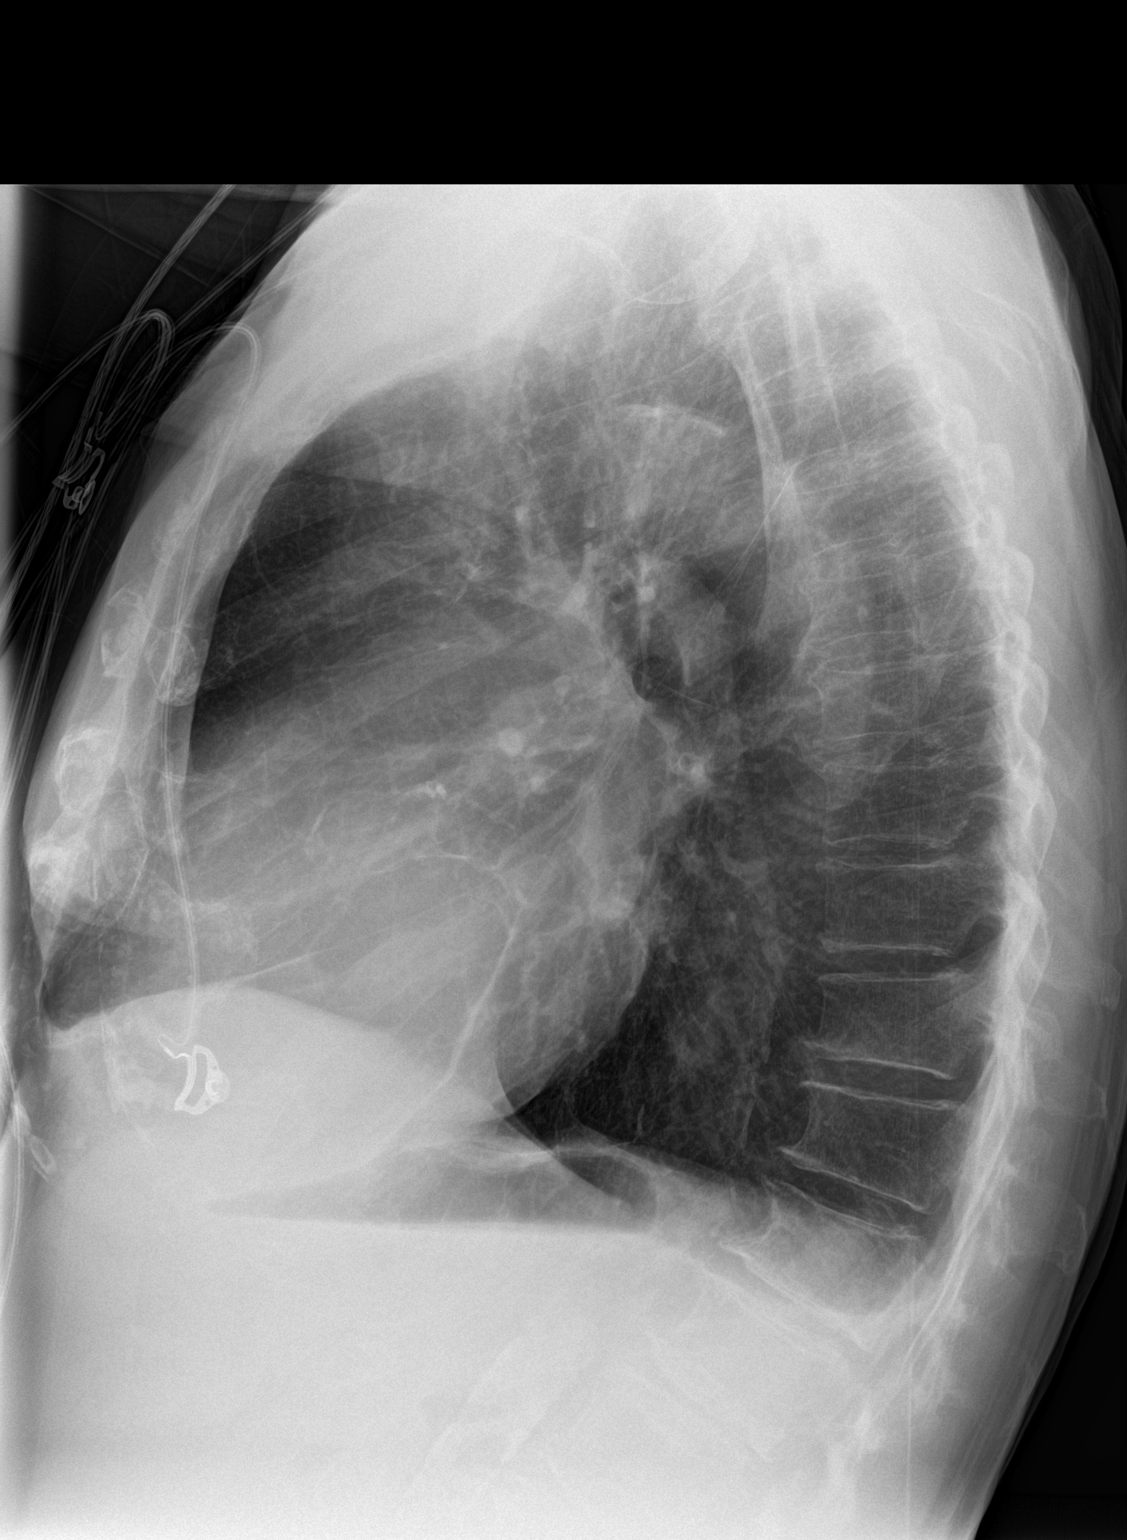

[2 of 2 positions shown; findings below may reference images not displayed]

FINDINGS: Cardiac shadow is enlarged. The lungs are hyperinflated bilaterally
consistent with COPD. No focal infiltrate or sizable effusion is
noted. Degenerative change of the thoracic spine is seen. Mild
aortic calcifications are noted.
IMPRESSION: COPD without acute abnormality.

## 2016-08-18 DIAGNOSIS — H903 Sensorineural hearing loss, bilateral: Secondary | ICD-10-CM | POA: Diagnosis not present

## 2016-11-05 DIAGNOSIS — M5441 Lumbago with sciatica, right side: Secondary | ICD-10-CM | POA: Diagnosis not present

## 2016-11-05 DIAGNOSIS — I480 Paroxysmal atrial fibrillation: Secondary | ICD-10-CM | POA: Diagnosis not present

## 2016-11-05 DIAGNOSIS — D649 Anemia, unspecified: Secondary | ICD-10-CM | POA: Diagnosis not present

## 2016-11-05 DIAGNOSIS — N183 Chronic kidney disease, stage 3 (moderate): Secondary | ICD-10-CM | POA: Diagnosis not present

## 2016-11-05 DIAGNOSIS — I10 Essential (primary) hypertension: Secondary | ICD-10-CM | POA: Diagnosis not present

## 2016-11-05 DIAGNOSIS — G8929 Other chronic pain: Secondary | ICD-10-CM | POA: Diagnosis not present

## 2016-11-12 DIAGNOSIS — M353 Polymyalgia rheumatica: Secondary | ICD-10-CM | POA: Diagnosis not present

## 2016-11-12 DIAGNOSIS — M5416 Radiculopathy, lumbar region: Secondary | ICD-10-CM | POA: Diagnosis not present

## 2016-11-12 DIAGNOSIS — N183 Chronic kidney disease, stage 3 (moderate): Secondary | ICD-10-CM | POA: Diagnosis not present

## 2016-11-12 DIAGNOSIS — I10 Essential (primary) hypertension: Secondary | ICD-10-CM | POA: Diagnosis not present

## 2016-11-12 DIAGNOSIS — Z0000 Encounter for general adult medical examination without abnormal findings: Secondary | ICD-10-CM | POA: Diagnosis not present

## 2016-11-12 DIAGNOSIS — I480 Paroxysmal atrial fibrillation: Secondary | ICD-10-CM | POA: Diagnosis not present

## 2016-11-12 DIAGNOSIS — M150 Primary generalized (osteo)arthritis: Secondary | ICD-10-CM | POA: Diagnosis not present

## 2017-01-05 ENCOUNTER — Other Ambulatory Visit: Payer: Self-pay | Admitting: Physical Medicine and Rehabilitation

## 2017-01-05 DIAGNOSIS — M48062 Spinal stenosis, lumbar region with neurogenic claudication: Secondary | ICD-10-CM | POA: Diagnosis not present

## 2017-01-05 DIAGNOSIS — M5136 Other intervertebral disc degeneration, lumbar region: Secondary | ICD-10-CM | POA: Diagnosis not present

## 2017-01-05 DIAGNOSIS — M5416 Radiculopathy, lumbar region: Secondary | ICD-10-CM | POA: Diagnosis not present

## 2017-01-15 ENCOUNTER — Ambulatory Visit
Admission: RE | Admit: 2017-01-15 | Discharge: 2017-01-15 | Disposition: A | Discharge status: Home / Self Care | Payer: Commercial Managed Care - HMO | Source: Ambulatory Visit | Attending: Physical Medicine and Rehabilitation | Admitting: Physical Medicine and Rehabilitation

## 2017-01-15 DIAGNOSIS — M545 Low back pain: Secondary | ICD-10-CM | POA: Diagnosis not present

## 2017-01-15 DIAGNOSIS — M5116 Intervertebral disc disorders with radiculopathy, lumbar region: Secondary | ICD-10-CM | POA: Insufficient documentation

## 2017-01-15 DIAGNOSIS — M48061 Spinal stenosis, lumbar region without neurogenic claudication: Secondary | ICD-10-CM | POA: Diagnosis not present

## 2017-01-15 DIAGNOSIS — M5416 Radiculopathy, lumbar region: Secondary | ICD-10-CM | POA: Diagnosis present

## 2017-01-29 DIAGNOSIS — M5136 Other intervertebral disc degeneration, lumbar region: Secondary | ICD-10-CM | POA: Diagnosis not present

## 2017-01-29 DIAGNOSIS — M5416 Radiculopathy, lumbar region: Secondary | ICD-10-CM | POA: Diagnosis not present

## 2017-01-29 DIAGNOSIS — M48062 Spinal stenosis, lumbar region with neurogenic claudication: Secondary | ICD-10-CM | POA: Diagnosis not present

## 2017-02-19 DIAGNOSIS — K006 Disturbances in tooth eruption: Secondary | ICD-10-CM | POA: Diagnosis not present

## 2017-03-19 DIAGNOSIS — M353 Polymyalgia rheumatica: Secondary | ICD-10-CM | POA: Diagnosis not present

## 2017-03-19 DIAGNOSIS — I480 Paroxysmal atrial fibrillation: Secondary | ICD-10-CM | POA: Diagnosis not present

## 2017-03-19 DIAGNOSIS — N183 Chronic kidney disease, stage 3 (moderate): Secondary | ICD-10-CM | POA: Diagnosis not present

## 2017-03-19 DIAGNOSIS — I10 Essential (primary) hypertension: Secondary | ICD-10-CM | POA: Diagnosis not present

## 2017-03-19 DIAGNOSIS — Z0000 Encounter for general adult medical examination without abnormal findings: Secondary | ICD-10-CM | POA: Diagnosis not present

## 2017-03-19 DIAGNOSIS — Z125 Encounter for screening for malignant neoplasm of prostate: Secondary | ICD-10-CM | POA: Diagnosis not present

## 2017-03-19 DIAGNOSIS — M150 Primary generalized (osteo)arthritis: Secondary | ICD-10-CM | POA: Diagnosis not present

## 2017-03-19 DIAGNOSIS — M5416 Radiculopathy, lumbar region: Secondary | ICD-10-CM | POA: Diagnosis not present

## 2017-03-25 DIAGNOSIS — M48062 Spinal stenosis, lumbar region with neurogenic claudication: Secondary | ICD-10-CM | POA: Diagnosis not present

## 2017-03-25 DIAGNOSIS — I10 Essential (primary) hypertension: Secondary | ICD-10-CM | POA: Diagnosis not present

## 2017-03-25 DIAGNOSIS — I480 Paroxysmal atrial fibrillation: Secondary | ICD-10-CM | POA: Diagnosis not present

## 2017-03-25 DIAGNOSIS — D649 Anemia, unspecified: Secondary | ICD-10-CM | POA: Diagnosis not present

## 2017-03-25 DIAGNOSIS — M150 Primary generalized (osteo)arthritis: Secondary | ICD-10-CM | POA: Diagnosis not present

## 2017-03-25 DIAGNOSIS — Z0000 Encounter for general adult medical examination without abnormal findings: Secondary | ICD-10-CM | POA: Diagnosis not present

## 2017-03-27 DIAGNOSIS — M5416 Radiculopathy, lumbar region: Secondary | ICD-10-CM | POA: Diagnosis not present

## 2017-03-27 DIAGNOSIS — M48062 Spinal stenosis, lumbar region with neurogenic claudication: Secondary | ICD-10-CM | POA: Diagnosis not present

## 2017-03-27 DIAGNOSIS — M5136 Other intervertebral disc degeneration, lumbar region: Secondary | ICD-10-CM | POA: Diagnosis not present

## 2017-04-09 DIAGNOSIS — H2511 Age-related nuclear cataract, right eye: Secondary | ICD-10-CM | POA: Diagnosis not present

## 2017-05-13 DIAGNOSIS — M5136 Other intervertebral disc degeneration, lumbar region: Secondary | ICD-10-CM | POA: Diagnosis not present

## 2017-05-13 DIAGNOSIS — M48062 Spinal stenosis, lumbar region with neurogenic claudication: Secondary | ICD-10-CM | POA: Diagnosis not present

## 2017-05-13 DIAGNOSIS — M5416 Radiculopathy, lumbar region: Secondary | ICD-10-CM | POA: Diagnosis not present

## 2017-06-11 DIAGNOSIS — M48062 Spinal stenosis, lumbar region with neurogenic claudication: Secondary | ICD-10-CM | POA: Diagnosis not present

## 2017-06-11 DIAGNOSIS — M5136 Other intervertebral disc degeneration, lumbar region: Secondary | ICD-10-CM | POA: Diagnosis not present

## 2017-06-11 DIAGNOSIS — M5416 Radiculopathy, lumbar region: Secondary | ICD-10-CM | POA: Diagnosis not present

## 2017-06-12 ENCOUNTER — Other Ambulatory Visit: Payer: Self-pay | Admitting: Physician Assistant

## 2017-06-12 ENCOUNTER — Other Ambulatory Visit: Payer: Self-pay | Admitting: Internal Medicine

## 2017-06-12 ENCOUNTER — Ambulatory Visit
Admission: RE | Admit: 2017-06-12 | Discharge: 2017-06-12 | Disposition: A | Discharge status: Home / Self Care | Payer: Medicare HMO | Source: Ambulatory Visit | Attending: Physician Assistant | Admitting: Physician Assistant

## 2017-06-12 DIAGNOSIS — N183 Chronic kidney disease, stage 3 (moderate): Secondary | ICD-10-CM | POA: Diagnosis not present

## 2017-06-12 DIAGNOSIS — M7989 Other specified soft tissue disorders: Secondary | ICD-10-CM

## 2017-06-12 DIAGNOSIS — I10 Essential (primary) hypertension: Secondary | ICD-10-CM | POA: Diagnosis not present

## 2017-06-12 DIAGNOSIS — M150 Primary generalized (osteo)arthritis: Secondary | ICD-10-CM | POA: Diagnosis not present

## 2017-06-12 DIAGNOSIS — M7121 Synovial cyst of popliteal space [Baker], right knee: Secondary | ICD-10-CM | POA: Diagnosis not present

## 2017-06-12 DIAGNOSIS — M353 Polymyalgia rheumatica: Secondary | ICD-10-CM | POA: Diagnosis not present

## 2017-06-12 DIAGNOSIS — I480 Paroxysmal atrial fibrillation: Secondary | ICD-10-CM | POA: Diagnosis not present

## 2017-06-12 DIAGNOSIS — Z23 Encounter for immunization: Secondary | ICD-10-CM | POA: Diagnosis not present

## 2017-07-02 DIAGNOSIS — M48062 Spinal stenosis, lumbar region with neurogenic claudication: Secondary | ICD-10-CM | POA: Diagnosis not present

## 2017-07-08 ENCOUNTER — Encounter
Admission: RE | Admit: 2017-07-08 | Discharge: 2017-07-08 | Disposition: A | Discharge status: Home / Self Care | Payer: Medicare HMO | Source: Ambulatory Visit | Attending: Neurosurgery | Admitting: Neurosurgery

## 2017-07-08 DIAGNOSIS — Z01812 Encounter for preprocedural laboratory examination: Secondary | ICD-10-CM | POA: Diagnosis not present

## 2017-07-08 DIAGNOSIS — Z01810 Encounter for preprocedural cardiovascular examination: Secondary | ICD-10-CM | POA: Diagnosis not present

## 2017-07-08 DIAGNOSIS — R9431 Abnormal electrocardiogram [ECG] [EKG]: Secondary | ICD-10-CM | POA: Diagnosis not present

## 2017-07-08 DIAGNOSIS — I4891 Unspecified atrial fibrillation: Secondary | ICD-10-CM | POA: Diagnosis not present

## 2017-07-08 DIAGNOSIS — I10 Essential (primary) hypertension: Secondary | ICD-10-CM | POA: Diagnosis not present

## 2017-07-08 HISTORY — DX: Malignant (primary) neoplasm, unspecified: C80.1

## 2017-07-08 LAB — CBC
HCT: 40.5 % (ref 40.0–52.0)
HEMOGLOBIN: 13.4 g/dL (ref 13.0–18.0)
MCH: 31 pg (ref 26.0–34.0)
MCHC: 33.1 g/dL (ref 32.0–36.0)
MCV: 93.7 fL (ref 80.0–100.0)
Platelets: 313 10*3/uL (ref 150–440)
RBC: 4.32 MIL/uL — AB (ref 4.40–5.90)
RDW: 13.6 % (ref 11.5–14.5)
WBC: 7.6 10*3/uL (ref 3.8–10.6)

## 2017-07-08 LAB — SURGICAL PCR SCREEN
MRSA, PCR: NEGATIVE
STAPHYLOCOCCUS AUREUS: NEGATIVE

## 2017-07-08 LAB — BASIC METABOLIC PANEL
ANION GAP: 10 (ref 5–15)
BUN: 25 mg/dL — ABNORMAL HIGH (ref 6–20)
CO2: 26 mmol/L (ref 22–32)
Calcium: 9.1 mg/dL (ref 8.9–10.3)
Chloride: 102 mmol/L (ref 101–111)
Creatinine, Ser: 1.67 mg/dL — ABNORMAL HIGH (ref 0.61–1.24)
GFR calc Af Amer: 40 mL/min — ABNORMAL LOW (ref >60)
GFR calc non Af Amer: 35 mL/min — ABNORMAL LOW (ref >60)
GLUCOSE: 70 mg/dL (ref 65–99)
POTASSIUM: 4.1 mmol/L (ref 3.5–5.1)
SODIUM: 138 mmol/L (ref 135–145)

## 2017-07-08 NOTE — Patient Instructions (Signed)
Your procedure is scheduled CW:[id number] 31, 2018  Report to [place]  To find out your arrival time please call [phone] between 1PM - 3PM on Tuesday, July 14, 2017  Remember: Instructions that are not followed completely may result in serious medical risk, up to and including death, or upon the discretion of your surgeon and anesthesiologist your surgery may need to be rescheduled.     _X__ 1. Do not eat food after midnight the night before your procedure.                 No gum chewing or hard candies. You may drink clear liquids up to 2 hours                 before you are scheduled to arrive for your surgery- DO not drink clear                 liquids within 2 hours of the start of your surgery.                 Clear Liquids include:  water, apple juice without pulp, clear carbohydrate                 drink such as Clearfast of Gartorade, Black Coffee or Tea (Do not add                 anything to coffee or tea).     _X__ 2.  No Alcohol for 24 hours before or after surgery.   _X__ 3.  Do Not Smoke or use e-cigarettes For 24 Hours Prior to Your Surgery.                 Do not use any chewable tobacco products for at least 6 hours prior to                 surgery.  ____  4.  Bring all medications with you on the day of surgery if instructed.   _X___  5.  Notify your doctor if there is any change in your medical condition      (cold, fever, infections).     Do not wear jewelry, make-up, hairpins, clips or nail polish. Do not wear lotions, powders, or perfumes. You may wear deodorant. Do not shave 48 hours prior to surgery. Men may shave face and neck. Do not bring valuables to the hospital.    [facility] is not responsible for any belongings or valuables.  Contacts, dentures or bridgework may not be worn into surgery. Leave your suitcase in the car. After surgery it may be brought to your room. For patients admitted to the  hospital, discharge time is determined by your treatment team.   Patients discharged the day of surgery will not be allowed to drive home.   Please read over the following fact sheets that you were given:   MRSA INFECTION, STOP THE SPREAD             PREPARING FOR SURGERY   _X__ Take these medicines the morning of surgery with A SIP OF WATER:    1.GABAPENTIN  2. METOPROLOL   3. PRILOSEC  4. DILTIAZEM  5.  6.  ____ Fleet Enema (as directed)   __X_ Use CHG Soap as directed  ____ Use inhalers on the day of surgery  ____ Stop metformin 2 days prior to surgery    ____ Take 1/2 of usual insulin dose the  night before surgery. No insulin the morning          of surgery.   ____ Stop Coumadin/Plavix/ASPIRIN AS OF TODAY .... July 08, 2017  ____ Stop Anti-inflammatories AS OF TODAY ... July 08, 2017  ...(THIS INCLUDES IBUPROFEN/ALEVE/ADVIL/NAPROXEN)   _X___ Stop supplements until after surgery.  YOU MAY CONTINUE TO TAKE THE VITAMIN B 12   ____ Bring C-Pap to the hospital.    BRING A COPY OF YOUR ADVANCE DIRECTIVES TO [place] TO KEEP IN YOUR RECORDS.  CONTINUE TO TAKE THE MEVACOR AND FERROUS SULFATE TABLET UP UNTIL THE DAY BEFORE SURGERY.

## 2017-07-08 NOTE — Pre-Procedure Instructions (Signed)
Called Dr. Newman Pies' office and spoke with [facility] regarding a clearance from Dr. Ginette Pitman. She said that they tell the patient to get the clearance themselves.  She reviewed his record and said that she will send a note to PCP office.  Agreed to return any answer to pre-admit testing prior to surgery.

## 2017-07-09 DIAGNOSIS — I480 Paroxysmal atrial fibrillation: Secondary | ICD-10-CM | POA: Diagnosis not present

## 2017-07-09 DIAGNOSIS — I10 Essential (primary) hypertension: Secondary | ICD-10-CM | POA: Diagnosis not present

## 2017-07-09 DIAGNOSIS — M48062 Spinal stenosis, lumbar region with neurogenic claudication: Secondary | ICD-10-CM | POA: Diagnosis not present

## 2017-07-09 DIAGNOSIS — Z01818 Encounter for other preprocedural examination: Secondary | ICD-10-CM | POA: Diagnosis not present

## 2017-07-10 ENCOUNTER — Encounter
Admission: RE | Admit: 2017-07-10 | Discharge: 2017-07-10 | Disposition: A | Discharge status: Home / Self Care | Payer: Medicare HMO | Source: Ambulatory Visit | Attending: Neurosurgery | Admitting: Neurosurgery

## 2017-07-10 DIAGNOSIS — I4891 Unspecified atrial fibrillation: Secondary | ICD-10-CM | POA: Diagnosis not present

## 2017-07-10 DIAGNOSIS — I10 Essential (primary) hypertension: Secondary | ICD-10-CM | POA: Diagnosis not present

## 2017-07-10 DIAGNOSIS — Z01812 Encounter for preprocedural laboratory examination: Secondary | ICD-10-CM | POA: Diagnosis not present

## 2017-07-10 DIAGNOSIS — Z01810 Encounter for preprocedural cardiovascular examination: Secondary | ICD-10-CM | POA: Diagnosis not present

## 2017-07-10 DIAGNOSIS — R9431 Abnormal electrocardiogram [ECG] [EKG]: Secondary | ICD-10-CM | POA: Diagnosis not present

## 2017-07-10 LAB — URINALYSIS, ROUTINE W REFLEX MICROSCOPIC
Bilirubin Urine: NEGATIVE
GLUCOSE, UA: NEGATIVE mg/dL
Hgb urine dipstick: NEGATIVE
Ketones, ur: NEGATIVE mg/dL
LEUKOCYTES UA: NEGATIVE
NITRITE: NEGATIVE
PH: 6 (ref 5.0–8.0)
Protein, ur: NEGATIVE mg/dL
SPECIFIC GRAVITY, URINE: 1.01 (ref 1.005–1.030)

## 2017-07-10 LAB — APTT: aPTT: 28 seconds (ref 24–36)

## 2017-07-10 LAB — PROTIME-INR
INR: 0.93
PROTHROMBIN TIME: 12.4 s (ref 11.4–15.2)

## 2017-07-15 ENCOUNTER — Ambulatory Visit: Payer: Medicare HMO | Admitting: Certified Registered Nurse Anesthetist

## 2017-07-15 ENCOUNTER — Encounter
Admission: RE | Disposition: A | Discharge status: Home / Self Care | Payer: Self-pay | Source: Ambulatory Visit | Attending: Neurosurgery

## 2017-07-15 ENCOUNTER — Observation Stay
Admission: RE | Admit: 2017-07-15 | Discharge: 2017-07-16 | Disposition: A | Discharge status: Home / Self Care | Payer: Medicare HMO | Source: Ambulatory Visit | Attending: Neurosurgery | Admitting: Neurosurgery

## 2017-07-15 ENCOUNTER — Ambulatory Visit: Payer: Medicare HMO

## 2017-07-15 DIAGNOSIS — I10 Essential (primary) hypertension: Secondary | ICD-10-CM | POA: Diagnosis not present

## 2017-07-15 DIAGNOSIS — Z79899 Other long term (current) drug therapy: Secondary | ICD-10-CM | POA: Insufficient documentation

## 2017-07-15 DIAGNOSIS — Z85828 Personal history of other malignant neoplasm of skin: Secondary | ICD-10-CM | POA: Insufficient documentation

## 2017-07-15 DIAGNOSIS — I129 Hypertensive chronic kidney disease with stage 1 through stage 4 chronic kidney disease, or unspecified chronic kidney disease: Secondary | ICD-10-CM | POA: Insufficient documentation

## 2017-07-15 DIAGNOSIS — M48062 Spinal stenosis, lumbar region with neurogenic claudication: Secondary | ICD-10-CM | POA: Diagnosis not present

## 2017-07-15 DIAGNOSIS — E7800 Pure hypercholesterolemia, unspecified: Secondary | ICD-10-CM | POA: Insufficient documentation

## 2017-07-15 DIAGNOSIS — M353 Polymyalgia rheumatica: Secondary | ICD-10-CM | POA: Insufficient documentation

## 2017-07-15 DIAGNOSIS — Z419 Encounter for procedure for purposes other than remedying health state, unspecified: Secondary | ICD-10-CM

## 2017-07-15 DIAGNOSIS — L570 Actinic keratosis: Secondary | ICD-10-CM | POA: Insufficient documentation

## 2017-07-15 DIAGNOSIS — I480 Paroxysmal atrial fibrillation: Secondary | ICD-10-CM | POA: Diagnosis not present

## 2017-07-15 DIAGNOSIS — Z981 Arthrodesis status: Secondary | ICD-10-CM | POA: Diagnosis not present

## 2017-07-15 DIAGNOSIS — D631 Anemia in chronic kidney disease: Secondary | ICD-10-CM | POA: Diagnosis not present

## 2017-07-15 DIAGNOSIS — Z87891 Personal history of nicotine dependence: Secondary | ICD-10-CM | POA: Diagnosis not present

## 2017-07-15 DIAGNOSIS — I7300 Raynaud's syndrome without gangrene: Secondary | ICD-10-CM | POA: Diagnosis not present

## 2017-07-15 DIAGNOSIS — Z888 Allergy status to other drugs, medicaments and biological substances status: Secondary | ICD-10-CM | POA: Diagnosis not present

## 2017-07-15 DIAGNOSIS — N189 Chronic kidney disease, unspecified: Secondary | ICD-10-CM | POA: Diagnosis not present

## 2017-07-15 DIAGNOSIS — M170 Bilateral primary osteoarthritis of knee: Secondary | ICD-10-CM | POA: Insufficient documentation

## 2017-07-15 DIAGNOSIS — M19019 Primary osteoarthritis, unspecified shoulder: Secondary | ICD-10-CM | POA: Insufficient documentation

## 2017-07-15 DIAGNOSIS — M5416 Radiculopathy, lumbar region: Secondary | ICD-10-CM | POA: Diagnosis present

## 2017-07-15 HISTORY — PX: LUMBAR LAMINECTOMY/DECOMPRESSION MICRODISCECTOMY: SHX5026

## 2017-07-15 LAB — TYPE AND SCREEN
ABO/RH(D): O POS
ANTIBODY SCREEN: NEGATIVE

## 2017-07-15 SURGERY — LUMBAR LAMINECTOMY/DECOMPRESSION MICRODISCECTOMY 1 LEVEL
Anesthesia: General

## 2017-07-15 MED ORDER — SODIUM CHLORIDE 0.9 % IV SOLN
INTRAVENOUS | Status: DC
  Administered 2017-07-15: 22:00:00 via INTRAVENOUS

## 2017-07-15 MED ORDER — THROMBIN (RECOMBINANT) 5000 UNITS EX SOLR
CUTANEOUS | Status: AC
  Filled 2017-07-15: qty 5000

## 2017-07-15 MED ORDER — BUPIVACAINE LIPOSOME 1.3 % IJ SUSP
INTRAMUSCULAR | Status: DC | PRN
  Administered 2017-07-15: 20 mL

## 2017-07-15 MED ORDER — LIDOCAINE HCL (CARDIAC) 20 MG/ML IV SOLN
INTRAVENOUS | Status: DC | PRN
  Administered 2017-07-15: 60 mg via INTRAVENOUS

## 2017-07-15 MED ORDER — BUPIVACAINE-EPINEPHRINE (PF) 0.5% -1:200000 IJ SOLN
INTRAMUSCULAR | Status: DC | PRN
  Administered 2017-07-15: 10 mL

## 2017-07-15 MED ORDER — SODIUM CHLORIDE 0.9 % IJ SOLN
INTRAMUSCULAR | Status: AC
  Filled 2017-07-15: qty 20

## 2017-07-15 MED ORDER — PANTOPRAZOLE SODIUM 40 MG PO TBEC
40.0000 mg | DELAYED_RELEASE_TABLET | Freq: Every day | ORAL | Status: DC
  Administered 2017-07-16: 40 mg via ORAL
  Filled 2017-07-15: qty 1

## 2017-07-15 MED ORDER — METHYLPREDNISOLONE ACETATE 40 MG/ML IJ SUSP
INTRAMUSCULAR | Status: AC
  Filled 2017-07-15: qty 1

## 2017-07-15 MED ORDER — SENNOSIDES-DOCUSATE SODIUM 8.6-50 MG PO TABS: 1.0000 | ORAL_TABLET | Freq: Every evening | ORAL | Status: DC | PRN

## 2017-07-15 MED ORDER — ONDANSETRON HCL 4 MG/2ML IJ SOLN: 4.0000 mg | Freq: Once | INTRAMUSCULAR | Status: DC | PRN

## 2017-07-15 MED ORDER — ACETAMINOPHEN 10 MG/ML IV SOLN
INTRAVENOUS | Status: AC
  Filled 2017-07-15: qty 100

## 2017-07-15 MED ORDER — PHENYLEPHRINE HCL 10 MG/ML IJ SOLN
INTRAMUSCULAR | Status: DC | PRN
  Administered 2017-07-15: 35 ug/min via INTRAVENOUS

## 2017-07-15 MED ORDER — DILTIAZEM HCL ER COATED BEADS 240 MG PO CP24
240.0000 mg | ORAL_CAPSULE | Freq: Every day | ORAL | Status: DC
  Administered 2017-07-16: 240 mg via ORAL
  Filled 2017-07-15: qty 1

## 2017-07-15 MED ORDER — SODIUM CHLORIDE 0.9% FLUSH: 3.0000 mL | INTRAVENOUS | Status: DC | PRN

## 2017-07-15 MED ORDER — GABAPENTIN 300 MG PO CAPS
300.0000 mg | ORAL_CAPSULE | Freq: Three times a day (TID) | ORAL | Status: DC
  Administered 2017-07-15 – 2017-07-16 (×3): 300 mg via ORAL
  Filled 2017-07-15 (×3): qty 1

## 2017-07-15 MED ORDER — GELATIN ABSORBABLE 12-7 MM EX MISC
CUTANEOUS | Status: AC
  Filled 2017-07-15: qty 1

## 2017-07-15 MED ORDER — EPHEDRINE SULFATE 50 MG/ML IJ SOLN
INTRAMUSCULAR | Status: DC | PRN
  Administered 2017-07-15: 5 mg via INTRAVENOUS
  Administered 2017-07-15: 10 mg via INTRAVENOUS

## 2017-07-15 MED ORDER — SODIUM CHLORIDE 0.9 % IJ SOLN
INTRAMUSCULAR | Status: DC | PRN
  Administered 2017-07-15: 20 mL

## 2017-07-15 MED ORDER — FENTANYL CITRATE (PF) 100 MCG/2ML IJ SOLN
25.0000 ug | INTRAMUSCULAR | Status: DC | PRN
Start: 2017-07-15 — End: 2017-07-15

## 2017-07-15 MED ORDER — GELATIN ABSORBABLE 12-7 MM EX MISC
CUTANEOUS | Status: DC | PRN
  Administered 2017-07-15: 1

## 2017-07-15 MED ORDER — ACETAMINOPHEN 500 MG PO TABS
1000.0000 mg | ORAL_TABLET | Freq: Four times a day (QID) | ORAL | Status: AC
  Administered 2017-07-16 (×2): 1000 mg via ORAL
  Filled 2017-07-15 (×3): qty 2

## 2017-07-15 MED ORDER — SUCCINYLCHOLINE CHLORIDE 20 MG/ML IJ SOLN
INTRAMUSCULAR | Status: DC | PRN
  Administered 2017-07-15: 80 mg via INTRAVENOUS

## 2017-07-15 MED ORDER — PROPOFOL 10 MG/ML IV BOLUS
INTRAVENOUS | Status: AC
  Filled 2017-07-15: qty 20

## 2017-07-15 MED ORDER — FENTANYL CITRATE (PF) 100 MCG/2ML IJ SOLN
INTRAMUSCULAR | Status: DC | PRN
  Administered 2017-07-15 (×2): 50 ug via INTRAVENOUS

## 2017-07-15 MED ORDER — FERROUS SULFATE 325 (65 FE) MG PO TABS
325.0000 mg | ORAL_TABLET | Freq: Every day | ORAL | Status: DC
  Administered 2017-07-16: 325 mg via ORAL
  Filled 2017-07-15: qty 1

## 2017-07-15 MED ORDER — ONDANSETRON HCL 4 MG/2ML IJ SOLN: 4.0000 mg | Freq: Four times a day (QID) | INTRAMUSCULAR | Status: DC | PRN

## 2017-07-15 MED ORDER — SODIUM CHLORIDE FLUSH 0.9 % IV SOLN
INTRAVENOUS | Status: AC
  Filled 2017-07-15: qty 10

## 2017-07-15 MED ORDER — METHOCARBAMOL 500 MG PO TABS
500.0000 mg | ORAL_TABLET | Freq: Four times a day (QID) | ORAL | Status: DC | PRN
  Administered 2017-07-15: 500 mg via ORAL
  Filled 2017-07-15: qty 1

## 2017-07-15 MED ORDER — ACETAMINOPHEN 10 MG/ML IV SOLN
INTRAVENOUS | Status: DC | PRN
  Administered 2017-07-15: 1000 mg via INTRAVENOUS

## 2017-07-15 MED ORDER — SODIUM CHLORIDE 0.9 % IV SOLN: 250.0000 mL | INTRAVENOUS | Status: DC

## 2017-07-15 MED ORDER — THROMBIN (RECOMBINANT) 5000 UNITS EX SOLR
CUTANEOUS | Status: DC | PRN
  Administered 2017-07-15: 5000 [IU] via TOPICAL

## 2017-07-15 MED ORDER — LACTATED RINGERS IV SOLN
INTRAVENOUS | Status: DC | PRN
  Administered 2017-07-15: 07:00:00 via INTRAVENOUS

## 2017-07-15 MED ORDER — DEXAMETHASONE SODIUM PHOSPHATE 10 MG/ML IJ SOLN
INTRAMUSCULAR | Status: DC | PRN
  Administered 2017-07-15: 5 mg via INTRAVENOUS

## 2017-07-15 MED ORDER — CEFAZOLIN SODIUM-DEXTROSE 2-4 GM/100ML-% IV SOLN
INTRAVENOUS | Status: AC
  Filled 2017-07-15: qty 100

## 2017-07-15 MED ORDER — BACITRACIN 50000 UNITS IM SOLR
INTRAMUSCULAR | Status: DC | PRN
  Administered 2017-07-15: 50000 [IU]

## 2017-07-15 MED ORDER — CEFAZOLIN SODIUM-DEXTROSE 2-4 GM/100ML-% IV SOLN
2.0000 g | Freq: Once | INTRAVENOUS | Status: AC
  Administered 2017-07-15: 2 g via INTRAVENOUS

## 2017-07-15 MED ORDER — HYDROMORPHONE HCL 1 MG/ML IJ SOLN
0.5000 mg | INTRAMUSCULAR | Status: DC | PRN
Start: 2017-07-15 — End: 2017-07-16

## 2017-07-15 MED ORDER — METOPROLOL TARTRATE 25 MG PO TABS
25.0000 mg | ORAL_TABLET | Freq: Two times a day (BID) | ORAL | Status: DC
  Administered 2017-07-15 – 2017-07-16 (×2): 25 mg via ORAL
  Filled 2017-07-15 (×2): qty 1

## 2017-07-15 MED ORDER — BUPIVACAINE HCL (PF) 0.5 % IJ SOLN
INTRAMUSCULAR | Status: AC
  Filled 2017-07-15: qty 30

## 2017-07-15 MED ORDER — FENTANYL CITRATE (PF) 100 MCG/2ML IJ SOLN
INTRAMUSCULAR | Status: AC
  Filled 2017-07-15: qty 2

## 2017-07-15 MED ORDER — OXYCODONE HCL 5 MG PO TABS: 10.0000 mg | ORAL_TABLET | ORAL | Status: DC | PRN

## 2017-07-15 MED ORDER — SUGAMMADEX SODIUM 200 MG/2ML IV SOLN
INTRAVENOUS | Status: DC | PRN
  Administered 2017-07-15: 127 mg via INTRAVENOUS

## 2017-07-15 MED ORDER — IBUPROFEN 600 MG PO TABS: 600.0000 mg | ORAL_TABLET | Freq: Four times a day (QID) | ORAL | Status: DC | PRN

## 2017-07-15 MED ORDER — LIDOCAINE HCL (PF) 2 % IJ SOLN
INTRAMUSCULAR | Status: AC
  Filled 2017-07-15: qty 10

## 2017-07-15 MED ORDER — BACITRACIN 50000 UNITS IM SOLR
INTRAMUSCULAR | Status: AC
  Filled 2017-07-15: qty 1

## 2017-07-15 MED ORDER — SUCCINYLCHOLINE CHLORIDE 20 MG/ML IJ SOLN
INTRAMUSCULAR | Status: AC
  Filled 2017-07-15: qty 1

## 2017-07-15 MED ORDER — ROCURONIUM BROMIDE 100 MG/10ML IV SOLN
INTRAVENOUS | Status: DC | PRN
  Administered 2017-07-15: 30 mg via INTRAVENOUS
  Administered 2017-07-15: 5 mg via INTRAVENOUS

## 2017-07-15 MED ORDER — PRAVASTATIN SODIUM 10 MG PO TABS
10.0000 mg | ORAL_TABLET | Freq: Every day | ORAL | Status: DC
  Filled 2017-07-15: qty 1

## 2017-07-15 MED ORDER — BUPIVACAINE-EPINEPHRINE (PF) 0.5% -1:200000 IJ SOLN
INTRAMUSCULAR | Status: AC
  Filled 2017-07-15: qty 30

## 2017-07-15 MED ORDER — ACETAMINOPHEN 650 MG RE SUPP: 650.0000 mg | RECTAL | Status: DC | PRN

## 2017-07-15 MED ORDER — ACETAMINOPHEN 325 MG PO TABS: 650.0000 mg | ORAL_TABLET | ORAL | Status: DC | PRN

## 2017-07-15 MED ORDER — SEVOFLURANE IN SOLN
RESPIRATORY_TRACT | Status: AC
  Filled 2017-07-15: qty 500

## 2017-07-15 MED ORDER — ONDANSETRON HCL 4 MG PO TABS: 4.0000 mg | ORAL_TABLET | Freq: Four times a day (QID) | ORAL | Status: DC | PRN

## 2017-07-15 MED ORDER — BUPIVACAINE HCL 0.5 % IJ SOLN
INTRAMUSCULAR | Status: DC | PRN
  Administered 2017-07-15: 20 mL

## 2017-07-15 MED ORDER — DEXTROSE 5 % IV SOLN
500.0000 mg | Freq: Four times a day (QID) | INTRAVENOUS | Status: DC | PRN
  Filled 2017-07-15: qty 5

## 2017-07-15 MED ORDER — PROPOFOL 10 MG/ML IV BOLUS
INTRAVENOUS | Status: DC | PRN
  Administered 2017-07-15: 110 mg via INTRAVENOUS

## 2017-07-15 MED ORDER — LACTATED RINGERS IV SOLN: INTRAVENOUS | Status: DC

## 2017-07-15 MED ORDER — OXYCODONE HCL 5 MG PO TABS
5.0000 mg | ORAL_TABLET | ORAL | Status: DC | PRN
Start: 2017-07-15 — End: 2017-07-16

## 2017-07-15 MED ORDER — VITAMIN B-12 1000 MCG PO TABS
1000.0000 ug | ORAL_TABLET | Freq: Every day | ORAL | Status: DC
  Administered 2017-07-16: 1000 ug via ORAL
  Filled 2017-07-15: qty 1

## 2017-07-15 MED ORDER — SODIUM CHLORIDE 0.9% FLUSH: 3.0000 mL | Freq: Two times a day (BID) | INTRAVENOUS | Status: DC

## 2017-07-15 MED ORDER — ONDANSETRON HCL 4 MG/2ML IJ SOLN
INTRAMUSCULAR | Status: DC | PRN
  Administered 2017-07-15: 4 mg via INTRAVENOUS

## 2017-07-15 MED ORDER — BUPIVACAINE LIPOSOME 1.3 % IJ SUSP
INTRAMUSCULAR | Status: AC
  Filled 2017-07-15: qty 20

## 2017-07-15 SURGICAL SUPPLY — 67 items
BLADE BOVIE TIP EXT 4 (BLADE) ×3 IMPLANT
BUR NEURO DRILL SOFT 3.0X3.8M (BURR) ×3 IMPLANT
CANISTER SUCT 1200ML W/VALVE (MISCELLANEOUS) ×6 IMPLANT
CHLORAPREP W/TINT 26ML (MISCELLANEOUS) ×6 IMPLANT
CNTNR SPEC 2.5X3XGRAD LEK (MISCELLANEOUS) ×1
CONT SPEC 4OZ STER OR WHT (MISCELLANEOUS) ×2
CONTAINER SPEC 2.5X3XGRAD LEK (MISCELLANEOUS) ×1 IMPLANT
COUNTER NEEDLE 20/40 LG (NEEDLE) ×3 IMPLANT
COVER LIGHT HANDLE STERIS (MISCELLANEOUS) ×6 IMPLANT
CUP MEDICINE 2OZ PLAST GRAD ST (MISCELLANEOUS) ×6 IMPLANT
DERMABOND ADVANCED (GAUZE/BANDAGES/DRESSINGS) ×2
DERMABOND ADVANCED .7 DNX12 (GAUZE/BANDAGES/DRESSINGS) ×1 IMPLANT
DRAPE C-ARM 42X72 X-RAY (DRAPES) ×6 IMPLANT
DRAPE LAPAROTOMY 100X77 ABD (DRAPES) ×3 IMPLANT
DRAPE MICROSCOPE SPINE 48X150 (DRAPES) ×3 IMPLANT
DRAPE POUCH INSTRU U-SHP 10X18 (DRAPES) ×3 IMPLANT
DRAPE SURG 17X11 SM STRL (DRAPES) ×12 IMPLANT
DRSG TEGADERM 4X4.75 (GAUZE/BANDAGES/DRESSINGS) IMPLANT
DRSG TELFA 4X3 1S NADH ST (GAUZE/BANDAGES/DRESSINGS) IMPLANT
DURASEAL APPLICATOR TIP (TIP) IMPLANT
DURASEAL SPINE SEALANT 3ML (MISCELLANEOUS) IMPLANT
ELECT CAUTERY BLADE TIP 2.5 (TIP) ×3
ELECT EZSTD 165MM 6.5IN (MISCELLANEOUS)
ELECT REM PT RETURN 9FT ADLT (ELECTROSURGICAL) ×3
ELECTRODE CAUTERY BLDE TIP 2.5 (TIP) ×1 IMPLANT
ELECTRODE EZSTD 165MM 6.5IN (MISCELLANEOUS) IMPLANT
ELECTRODE REM PT RTRN 9FT ADLT (ELECTROSURGICAL) ×1 IMPLANT
FRAME EYE SHIELD (PROTECTIVE WEAR) ×3 IMPLANT
GLOVE BIO SURGEON STRL SZ 6.5 (GLOVE) ×4 IMPLANT
GLOVE BIO SURGEONS STRL SZ 6.5 (GLOVE) ×2
GLOVE BIOGEL PI IND STRL 7.0 (GLOVE) ×2 IMPLANT
GLOVE BIOGEL PI INDICATOR 7.0 (GLOVE) ×4
GLOVE SURG SYN 8.5  E (GLOVE) ×6
GLOVE SURG SYN 8.5 E (GLOVE) ×3 IMPLANT
GOWN SRG XL LVL 3 NONREINFORCE (GOWNS) ×1 IMPLANT
GOWN STRL NON-REIN TWL XL LVL3 (GOWNS) ×2
GOWN STRL REUS W/ TWL LRG LVL3 (GOWN DISPOSABLE) ×1 IMPLANT
GOWN STRL REUS W/TWL LRG LVL3 (GOWN DISPOSABLE) ×2
GRADUATE 1200CC STRL 31836 (MISCELLANEOUS) ×3 IMPLANT
KIT SPINAL PRONEVIEW (KITS) ×3 IMPLANT
KNIFE BAYONET SHORT DISCETOMY (MISCELLANEOUS) IMPLANT
MARKER SKIN DUAL TIP RULER LAB (MISCELLANEOUS) ×9 IMPLANT
NDL SAFETY ECLIPSE 18X1.5 (NEEDLE) ×1 IMPLANT
NEEDLE HYPO 18GX1.5 SHARP (NEEDLE) ×2
NEEDLE HYPO 22GX1.5 SAFETY (NEEDLE) ×3 IMPLANT
NS IRRIG 1000ML POUR BTL (IV SOLUTION) ×3 IMPLANT
PACK LAMINECTOMY NEURO (CUSTOM PROCEDURE TRAY) ×3 IMPLANT
PAD ARMBOARD 7.5X6 YLW CONV (MISCELLANEOUS) ×3 IMPLANT
PATTIES SURGICAL .5X1.5 (GAUZE/BANDAGES/DRESSINGS) IMPLANT
SPOGE SURGIFLO 8M (HEMOSTASIS) ×2
SPONGE SURGIFLO 8M (HEMOSTASIS) ×1 IMPLANT
STAPLER SKIN PROX 35W (STAPLE) IMPLANT
SUT DVC VLOC 3-0 CL 6 P-12 (SUTURE) ×3 IMPLANT
SUT NURALON 4 0 TR CR/8 (SUTURE) IMPLANT
SUT VIC AB 0 CT1 27 (SUTURE)
SUT VIC AB 0 CT1 27XCR 8 STRN (SUTURE) IMPLANT
SUT VIC AB 2-0 CT1 18 (SUTURE) ×3 IMPLANT
SUT VICRYL 0 AB UR-6 (SUTURE) ×3 IMPLANT
SYR 20CC LL (SYRINGE) ×3 IMPLANT
SYR 30ML LL (SYRINGE) ×6 IMPLANT
SYR 3ML LL SCALE MARK (SYRINGE) ×3 IMPLANT
SYRINGE 10CC LL (SYRINGE) ×3 IMPLANT
TOWEL OR 17X26 4PK STRL BLUE (TOWEL DISPOSABLE) ×12 IMPLANT
TUBE MATRX SPINL 18MM 6CM DISP (INSTRUMENTS) ×2
TUBE METRX SPINAL 18X6 DISP (INSTRUMENTS) ×1 IMPLANT
TUBING CONNECTING 10 (TUBING) ×2 IMPLANT
TUBING CONNECTING 10' (TUBING) ×1

## 2017-07-15 NOTE — Anesthesia Procedure Notes (Signed)
Procedure Name: Intubation Performed by: Demetrius Charity Pre-anesthesia Checklist: Patient identified, Patient being monitored, Timeout performed, Emergency Drugs available and Suction available Patient Re-evaluated:Patient Re-evaluated prior to induction Oxygen Delivery Method: Circle system utilized Preoxygenation: Pre-oxygenation with 100% oxygen Induction Type: IV induction Ventilation: Mask ventilation without difficulty Laryngoscope Size: 3 and McGraph Grade View: Grade II Tube type: Reinforced Tube size: 7.0 mm Number of attempts: 1 Airway Equipment and Method: Stylet and Video-laryngoscopy Placement Confirmation: ETT inserted through vocal cords under direct vision,  positive ETCO2 and breath sounds checked- equal and bilateral Secured at: 24 cm Tube secured with: Tape Dental Injury: Teeth and Oropharynx as per pre-operative assessment

## 2017-07-15 NOTE — Progress Notes (Signed)
  History: GAYLE COLLARD is POD0 lumbar laminectomy and decompression L4/5 for neurogenic claudication due to lumbar spinal stenosis. Patient tolerated procedure well without complication. Denies back pain or lower extremity pain or numbness/tingling.   Physical Exam: Vitals:   07/15/17 1037 07/15/17 1045  BP:  123/66  Pulse: (!) 47 (!) 41  Resp: 13 11  Temp: (!) 97.1 F (36.2 C)   SpO2: 96% 95%    AA Ox3 CNI Strength:5/5 throughout, sensation intact lower extremities.   Assessment/Plan:  DASTAN KRIDER is doing well shortly after surgery. Will start pain control with oxycodone and robaxin. Neuro exam is unremarkable at this time. Will reevaluate resolution of symptoms when patient has ambulated.   mobilize pain control DVT prophylaxis  Marin Olp PA-C Department of Neurosurgery

## 2017-07-15 NOTE — Progress Notes (Signed)
Pharmacy note  Ancef 2 grams x1 for surgical prophylaxis ordered per consult.  Sim Boast, PharmD, BCPS  07/15/17 6:32 AM

## 2017-07-15 NOTE — Op Note (Signed)
Indications: Mr. Colin is a 83 yo male who presented with neurogenic claudication due to lumbar stenosis.  He failed conservative management and asked that we proceed with surgery.  Findings: lumbar stenosis  Preoperative Diagnosis: Lumbar Stenosis with neurogenic claudication Postoperative Diagnosis: same   EBL: 25 ml IVF: 600 ml Drains: none Disposition: Extubated and Stable to PACU Complications: none  No foley catheter was placed.   Preoperative Note:   Risks of surgery discussed include: infection, bleeding, stroke, coma, death, paralysis, CSF leak, nerve/spinal cord injury, numbness, tingling, weakness, complex regional pain syndrome, recurrent stenosis and/or disc herniation, vascular injury, development of instability, neck/back pain, need for further surgery, persistent symptoms, development of deformity, and the risks of anesthesia. They understood these risks and have agreed to proceed.  Operative Note:   1. L4-5 lumbar decompression including central laminectomy and bilateral medial facetectomies including foraminotomies  The patient was then brought from the preoperative center with intravenous access established.  The patient underwent general anesthesia and endotracheal tube intubation, and was then rotated on the [place] rail top where all pressure points were appropriately padded.  The skin was then thoroughly cleansed.  Perioperative antibiotic prophylaxis was administered.  Sterile prep and drapes were then applied and a timeout was then observed.  C-arm was brought into the field under sterile conditions and under lateral visualization the L4-5 interspace was identified and marked.  The incision was marked on the right and injected with local anesthetic. Once this was complete a 2 cm incision was opened with the use of a #10 blade knife.  The metrx tubes were sequentially advanced and confirmed in position. An 39mm by 49mm tube was locked in place to the bed side  attachment.  Fluoroscopy was then removed from the field.  The microscope was then sterilely brought into the field and muscle creep was hemostased with a bipolar and resected with a pituitary rongeur.  A Bovie extender was then used to expose the spinous process and lamina.  Careful attention was placed to not violate the facet capsule. A 3 mm matchstick drill bit was then used to make a hemi-laminotomy trough until the ligamentum flavum was exposed.  This was extended to the base of the spinous process and to the contralateral side to remove all the central bone from each side.  Once this was complete and the underlying ligamentum flavum was visualized, it was dissected with a curette and resected with Kerrison rongeurs.  Extensive ligamentum hypertrophy was noted, requiring a substantial amount of time and care for removal.  The dura was identified and palpated. The kerrison rongeur was then used to remove the medial facet bilaterally until no compression was noted.  A balltip probe was used to confirm decompression of the right L5 nerve root.  Additional attention was paid to completion of the contralateral L4-5 foraminotomy until the left L2 nerve root was completely free.  Once this was complete, L1-2 central decompression including medial facetectomy and foraminotomy was confirmed and decompression on both sides was confirmed. No CSF leak was noted.  A Depo-Medrol soaked Gelfoam pledget was placed in the defect.  The wound was copiously irrigated. The tube system was then removed under microscopic visualization and hemostasis was obtained with a bipolar.  The fascial layer was reapproximated with the use of a 0 Vicryl suture.  Subcutaneous tissue layer was reapproximated using 2-0 Vicryl suture.  3-0 monocryl was placed in subcuticular fashion. The skin was then cleansed and Dermabond was used to close the  skin opening.  Patient was then rotated back to the preoperative bed awakened from anesthesia and  taken to recovery all counts are correct in this case.  I performed the entire procedure with the assistance of Marin Olp PA as an Pensions consultant.  Osher Oettinger K. Izora Ribas MD

## 2017-07-15 NOTE — Anesthesia Preprocedure Evaluation (Signed)
Anesthesia Evaluation  Patient identified by MRN, date of birth, ID band Patient awake    Reviewed: Allergy & Precautions, H&P , NPO status , Patient's Chart, lab work & pertinent test results, reviewed documented beta blocker date and time   Airway Mallampati: III  TM Distance: >3 FB Neck ROM: full    Dental  (+) Teeth Intact   Pulmonary neg pulmonary ROS, former smoker,    Pulmonary exam normal        Cardiovascular Exercise Tolerance: Poor hypertension, On Medications negative cardio ROS Normal cardiovascular examAtrial Fibrillation  Rhythm:regular Rate:Normal     Neuro/Psych negative neurological ROS  negative psych ROS   GI/Hepatic negative GI ROS, Neg liver ROS,   Endo/Other  negative endocrine ROS  Renal/GU Renal diseasenegative Renal ROS  negative genitourinary   Musculoskeletal   Abdominal   Peds  Hematology negative hematology ROS (+)   Anesthesia Other Findings Past Medical History: No date: A-fib ([place]) No date: Cancer ([place])     Comment:  some skin cancers on skin. No date: Chronic back pain No date: CKD (chronic kidney disease)     Comment:  patient unaware of this diagnosis No date: High cholesterol No date: Hypertension No date: Osteoarthritis No date: PMR (polymyalgia rheumatica) ([facility]) Past Surgical History: 1998: CATARACT EXTRACTION; Left 02/20/2015: ESOPHAGOGASTRODUODENOSCOPY (EGD) WITH PROPOFOL; N/A     Comment:  Procedure: ESOPHAGOGASTRODUODENOSCOPY (EGD) WITH               PROPOFOL;  Surgeon: Lucilla Lame, MD;  Location: [facility]               ENDOSCOPY;  Service: Endoscopy;  Laterality: N/A; 1990: GANGLION CYST EXCISION; Right     Comment:  foot No date: HERNIA REPAIR; Right     Comment:  inguinal   Reproductive/Obstetrics negative OB ROS                             Anesthesia Physical Anesthesia Plan  ASA: III  Anesthesia Plan: General ETT   Post-op Pain  Management:    Induction:   PONV Risk Score and Plan:   Airway Management Planned:   Additional Equipment:   Intra-op Plan:   Post-operative Plan:   Informed Consent: I have reviewed the patients History and Physical, chart, labs and discussed the procedure including the risks, benefits and alternatives for the proposed anesthesia with the patient or authorized representative who has indicated his/her understanding and acceptance.   Dental Advisory Given  Plan Discussed with: CRNA  Anesthesia Plan Comments:         Anesthesia Quick Evaluation

## 2017-07-15 NOTE — Transfer of Care (Signed)
Immediate Anesthesia Transfer of Care Note  Patient: William Tapia  Procedure(s) Performed: LUMBAR LAMINECTOMY/DECOMPRESSION MICRODISCECTOMY 1 LEVEL-L4-5 (N/A )  Patient Location: PACU  Anesthesia Type:General  Level of Consciousness: sedated  Airway & Oxygen Therapy: Patient connected to face mask oxygen  Post-op Assessment: Post -op Vital signs reviewed and stable  Post vital signs: stable  Last Vitals:  Vitals:   07/15/17 0624 07/15/17 0933  BP: (!) 147/70 123/65  Pulse: (!) 51 (!) 48  Resp: 16 12  Temp: 36.9 C   SpO2: 98% 100%    Last Pain:  Vitals:   07/15/17 0624  TempSrc: Oral  PainSc: 3          Complications: No apparent anesthesia complications

## 2017-07-15 NOTE — H&P (Signed)
  I have reviewed and confirmed my history and physical from 07/02/17 with no additions or changes. Plan for L4-5 decompression.  Risks and benefits reviewed.  Heart sounds normal no MRG. Chest Clear to Auscultation Bilaterally.

## 2017-07-15 NOTE — Anesthesia Post-op Follow-up Note (Signed)
Anesthesia QCDR form completed.        

## 2017-07-15 NOTE — Progress Notes (Signed)
    Attending Progress Note  History: William Tapia is here for L4-5 decompression.  He is doing very well  Physical Exam: Vitals:   07/15/17 1045 07/15/17 1110  BP: 123/66 (!) 122/51  Pulse: (!) 41 (!) 46  Resp: 11 12  Temp:  98.2 F (36.8 C)  SpO2: 95% 93%    AA Ox3 CNI  Strength:5/5 throughout BLE  Data:  No results for input(s): NA, K, CL, CO2, BUN, CREATININE, LABGLOM, GLUCOSE, CALCIUM in the last 168 hours. No results for input(s): AST, ALT, ALKPHOS in the last 168 hours.  Invalid input(s): TBILI   No results for input(s): WBC, HGB, HCT, PLT in the last 168 hours.  Recent Labs Lab 07/10/17 0831  APTT 28  INR 0.93         Other tests/results: none  Assessment/Plan:  William Tapia is doing very well on POD0 with decreased pain and increased R ankle flexibility  - mobilize - pain control - PTOT   Meade Maw MD, [facility] Department of Neurosurgery

## 2017-07-16 DIAGNOSIS — M353 Polymyalgia rheumatica: Secondary | ICD-10-CM | POA: Diagnosis not present

## 2017-07-16 DIAGNOSIS — E7800 Pure hypercholesterolemia, unspecified: Secondary | ICD-10-CM | POA: Diagnosis not present

## 2017-07-16 DIAGNOSIS — D631 Anemia in chronic kidney disease: Secondary | ICD-10-CM | POA: Diagnosis not present

## 2017-07-16 DIAGNOSIS — I7300 Raynaud's syndrome without gangrene: Secondary | ICD-10-CM | POA: Diagnosis not present

## 2017-07-16 DIAGNOSIS — L570 Actinic keratosis: Secondary | ICD-10-CM | POA: Diagnosis not present

## 2017-07-16 DIAGNOSIS — M48062 Spinal stenosis, lumbar region with neurogenic claudication: Secondary | ICD-10-CM | POA: Diagnosis not present

## 2017-07-16 DIAGNOSIS — I480 Paroxysmal atrial fibrillation: Secondary | ICD-10-CM | POA: Diagnosis not present

## 2017-07-16 DIAGNOSIS — I129 Hypertensive chronic kidney disease with stage 1 through stage 4 chronic kidney disease, or unspecified chronic kidney disease: Secondary | ICD-10-CM | POA: Diagnosis not present

## 2017-07-16 DIAGNOSIS — N189 Chronic kidney disease, unspecified: Secondary | ICD-10-CM | POA: Diagnosis not present

## 2017-07-16 MED ORDER — METHOCARBAMOL 500 MG PO TABS: 500.0000 mg | ORAL_TABLET | Freq: Four times a day (QID) | ORAL | 0 refills | Status: DC | PRN

## 2017-07-16 MED ORDER — OXYCODONE HCL 5 MG PO TABS: 5.0000 mg | ORAL_TABLET | ORAL | 0 refills | Status: DC | PRN

## 2017-07-16 NOTE — Discharge Instructions (Signed)

## 2017-07-16 NOTE — Discharge Summary (Signed)
  History: ZAVEN KLEMENS is POD1 s/p lumbar decompression for lumbar stenosis with neurogenic claudication. Patient tolerated procedure well without complications. Denies any complaints including back pain, extremity pain/numbness/tingling. Patient has voided and eaten without issue.    Physical Exam: Vitals:   07/16/17 0432 07/16/17 0849  BP: (!) 143/73 (!) 155/75  Pulse: 78 75  Resp: 16 18  Temp: 97.8 F (36.6 C) 98.5 F (36.9 C)  SpO2: 97% 97%    AA Ox3 CNI Skin: incision site intact. Glue present.  Strength:5/5 throughout, sensation intact and symmetric.   Data:  No results for input(s): NA, K, CL, CO2, BUN, CREATININE, LABGLOM, GLUCOSE, CALCIUM in the last 168 hours. No results for input(s): AST, ALT, ALKPHOS in the last 168 hours.  Invalid input(s): TBILI   No results for input(s): WBC, HGB, HCT, PLT in the last 168 hours.  Recent Labs Lab 07/10/17 0831  APTT 28  INR 0.93        Assessment/Plan:  ZAYLAN KISSOON is POD1 s/p lumbar decompression and laminectomy for neurogenic claudication. Patient is recovering exceptionally well and he denies pain. Patient advised no bending, twisting, or lifting greater than 10 pounds for 6 weeks. Patient expressed understanding and all questions were answered. Due to symptom presentation when ambulating, patient still need to ambulate to determine symptom resolution. Small ambulatory movements have not yet reproduced symptoms. Pain will continue to be controlled with Tylenol and alsop oxycodone and robaxin as needed.  Advised to contact office if questions or concerns arise. Otherwise, follow up in clinic in 2 weeks to monitor progress.   Marin Olp PA-C Department of Neurosurgery

## 2017-07-16 NOTE — Care Management Note (Signed)
Case Management Note  Patient Details  Name: William Tapia MRN: [id number] Date of Birth: Feb 18, 1925  Subjective/Objective:   Met with patient at bedside. He lives with his daughter that has downs syndrome. This daughter has worked for the [employer] for 38 years. Patient was driving prior to admission. He stopped approximately 2 weeks before admission due to numbness. He has a son that lives 300 ft from him and other family members that are helping. Patient has ambulated in room. He has a cane and walker in the home. RNCM does not anticipate patient will have any discharge needs.                 Action/Plan:   Expected Discharge Date:  07/16/17               Expected Discharge Plan:  Home/Self Care  In-House Referral:     Discharge planning Services     Post Acute Care Choice:    Choice offered to:     DME Arranged:    DME Agency:     HH Arranged:    HH Agency:     Status of Service:  Completed, signed off  If discussed at [employer] of Stay Meetings, dates discussed:    Additional Comments:  Jolly Mango, RN 07/16/2017, 10:47 AM

## 2017-07-16 NOTE — Care Management Obs Status (Signed)
MEDICARE OBSERVATION STATUS NOTIFICATION   Patient Details  Name: William Tapia MRN: [id number] Date of Birth: Dec 11, 1924   Medicare Observation Status Notification Given:  Yes    Jolly Mango, RN 07/16/2017, 9:29 AM

## 2017-07-16 NOTE — Progress Notes (Signed)
Pt alert and oriented. Denies pain, incision clean with no drainage. Voiding adequate amt urine. No acute distress noted. Will continue to monitor

## 2017-07-16 NOTE — Progress Notes (Signed)
Discharge instructions given to and reviewed with patient and his son.  Patient and son verbalized understanding of all discharge instructions including new Rx, activity restrictions and follow up appointment. Patient ambulated around room with cane and tolerated well. Left 1A by wheelchair with Gabriel Cirri, NT and son.   Patient discharged home.

## 2017-07-17 DIAGNOSIS — Z4789 Encounter for other orthopedic aftercare: Secondary | ICD-10-CM | POA: Diagnosis not present

## 2017-07-17 DIAGNOSIS — R2689 Other abnormalities of gait and mobility: Secondary | ICD-10-CM | POA: Diagnosis not present

## 2017-07-17 DIAGNOSIS — I4891 Unspecified atrial fibrillation: Secondary | ICD-10-CM | POA: Diagnosis not present

## 2017-07-17 DIAGNOSIS — M6281 Muscle weakness (generalized): Secondary | ICD-10-CM | POA: Diagnosis not present

## 2017-07-20 DIAGNOSIS — M6281 Muscle weakness (generalized): Secondary | ICD-10-CM | POA: Diagnosis not present

## 2017-07-20 DIAGNOSIS — R2689 Other abnormalities of gait and mobility: Secondary | ICD-10-CM | POA: Diagnosis not present

## 2017-07-20 DIAGNOSIS — I4891 Unspecified atrial fibrillation: Secondary | ICD-10-CM | POA: Diagnosis not present

## 2017-07-20 DIAGNOSIS — Z4789 Encounter for other orthopedic aftercare: Secondary | ICD-10-CM | POA: Diagnosis not present

## 2017-07-20 NOTE — Anesthesia Postprocedure Evaluation (Signed)
Anesthesia Post Note  Patient: William Tapia  Procedure(s) Performed: LUMBAR LAMINECTOMY/DECOMPRESSION MICRODISCECTOMY 1 LEVEL-L4-5 (N/A )  Patient location during evaluation: PACU Anesthesia Type: General Level of consciousness: awake and alert Pain management: pain level controlled Vital Signs Assessment: post-procedure vital signs reviewed and stable Respiratory status: spontaneous breathing, nonlabored ventilation, respiratory function stable and patient connected to nasal cannula oxygen Cardiovascular status: blood pressure returned to baseline and stable Postop Assessment: no apparent nausea or vomiting Anesthetic complications: no     Last Vitals:  Vitals:   07/16/17 0849 07/16/17 1206  BP: (!) 155/75 127/67  Pulse: 75 63  Resp: 18   Temp: 36.9 C 36.8 C  SpO2: 97% 96%    Last Pain:  Vitals:   07/16/17 1206  TempSrc: Oral  PainSc:                  Molli Barrows

## 2017-07-21 DIAGNOSIS — M6281 Muscle weakness (generalized): Secondary | ICD-10-CM | POA: Diagnosis not present

## 2017-07-21 DIAGNOSIS — R2689 Other abnormalities of gait and mobility: Secondary | ICD-10-CM | POA: Diagnosis not present

## 2017-07-21 DIAGNOSIS — Z4789 Encounter for other orthopedic aftercare: Secondary | ICD-10-CM | POA: Diagnosis not present

## 2017-07-21 DIAGNOSIS — I4891 Unspecified atrial fibrillation: Secondary | ICD-10-CM | POA: Diagnosis not present

## 2017-07-22 DIAGNOSIS — R2689 Other abnormalities of gait and mobility: Secondary | ICD-10-CM | POA: Diagnosis not present

## 2017-07-22 DIAGNOSIS — I4891 Unspecified atrial fibrillation: Secondary | ICD-10-CM | POA: Diagnosis not present

## 2017-07-22 DIAGNOSIS — Z4789 Encounter for other orthopedic aftercare: Secondary | ICD-10-CM | POA: Diagnosis not present

## 2017-07-22 DIAGNOSIS — M6281 Muscle weakness (generalized): Secondary | ICD-10-CM | POA: Diagnosis not present

## 2017-07-24 DIAGNOSIS — I4891 Unspecified atrial fibrillation: Secondary | ICD-10-CM | POA: Diagnosis not present

## 2017-07-24 DIAGNOSIS — Z4789 Encounter for other orthopedic aftercare: Secondary | ICD-10-CM | POA: Diagnosis not present

## 2017-07-24 DIAGNOSIS — R2689 Other abnormalities of gait and mobility: Secondary | ICD-10-CM | POA: Diagnosis not present

## 2017-07-24 DIAGNOSIS — M6281 Muscle weakness (generalized): Secondary | ICD-10-CM | POA: Diagnosis not present

## 2017-07-27 DIAGNOSIS — I4891 Unspecified atrial fibrillation: Secondary | ICD-10-CM | POA: Diagnosis not present

## 2017-07-27 DIAGNOSIS — M6281 Muscle weakness (generalized): Secondary | ICD-10-CM | POA: Diagnosis not present

## 2017-07-27 DIAGNOSIS — R2689 Other abnormalities of gait and mobility: Secondary | ICD-10-CM | POA: Diagnosis not present

## 2017-07-27 DIAGNOSIS — Z4789 Encounter for other orthopedic aftercare: Secondary | ICD-10-CM | POA: Diagnosis not present

## 2017-07-28 DIAGNOSIS — Z4789 Encounter for other orthopedic aftercare: Secondary | ICD-10-CM | POA: Diagnosis not present

## 2017-07-28 DIAGNOSIS — R2689 Other abnormalities of gait and mobility: Secondary | ICD-10-CM | POA: Diagnosis not present

## 2017-07-28 DIAGNOSIS — I4891 Unspecified atrial fibrillation: Secondary | ICD-10-CM | POA: Diagnosis not present

## 2017-07-28 DIAGNOSIS — M6281 Muscle weakness (generalized): Secondary | ICD-10-CM | POA: Diagnosis not present

## 2017-07-29 DIAGNOSIS — R2689 Other abnormalities of gait and mobility: Secondary | ICD-10-CM | POA: Diagnosis not present

## 2017-07-29 DIAGNOSIS — Z4789 Encounter for other orthopedic aftercare: Secondary | ICD-10-CM | POA: Diagnosis not present

## 2017-07-29 DIAGNOSIS — M6281 Muscle weakness (generalized): Secondary | ICD-10-CM | POA: Diagnosis not present

## 2017-07-29 DIAGNOSIS — I4891 Unspecified atrial fibrillation: Secondary | ICD-10-CM | POA: Diagnosis not present

## 2017-07-31 DIAGNOSIS — I4891 Unspecified atrial fibrillation: Secondary | ICD-10-CM | POA: Diagnosis not present

## 2017-07-31 DIAGNOSIS — R2689 Other abnormalities of gait and mobility: Secondary | ICD-10-CM | POA: Diagnosis not present

## 2017-07-31 DIAGNOSIS — Z4789 Encounter for other orthopedic aftercare: Secondary | ICD-10-CM | POA: Diagnosis not present

## 2017-07-31 DIAGNOSIS — M6281 Muscle weakness (generalized): Secondary | ICD-10-CM | POA: Diagnosis not present

## 2017-08-04 DIAGNOSIS — Z4789 Encounter for other orthopedic aftercare: Secondary | ICD-10-CM | POA: Diagnosis not present

## 2017-08-04 DIAGNOSIS — M6281 Muscle weakness (generalized): Secondary | ICD-10-CM | POA: Diagnosis not present

## 2017-08-04 DIAGNOSIS — R2689 Other abnormalities of gait and mobility: Secondary | ICD-10-CM | POA: Diagnosis not present

## 2017-08-04 DIAGNOSIS — I4891 Unspecified atrial fibrillation: Secondary | ICD-10-CM | POA: Diagnosis not present

## 2017-08-05 DIAGNOSIS — M6281 Muscle weakness (generalized): Secondary | ICD-10-CM | POA: Diagnosis not present

## 2017-08-05 DIAGNOSIS — I4891 Unspecified atrial fibrillation: Secondary | ICD-10-CM | POA: Diagnosis not present

## 2017-08-05 DIAGNOSIS — R2689 Other abnormalities of gait and mobility: Secondary | ICD-10-CM | POA: Diagnosis not present

## 2017-08-05 DIAGNOSIS — Z4789 Encounter for other orthopedic aftercare: Secondary | ICD-10-CM | POA: Diagnosis not present

## 2017-08-14 DIAGNOSIS — R2689 Other abnormalities of gait and mobility: Secondary | ICD-10-CM | POA: Diagnosis not present

## 2017-08-14 DIAGNOSIS — Z4789 Encounter for other orthopedic aftercare: Secondary | ICD-10-CM | POA: Diagnosis not present

## 2017-08-14 DIAGNOSIS — M6281 Muscle weakness (generalized): Secondary | ICD-10-CM | POA: Diagnosis not present

## 2017-08-14 DIAGNOSIS — I4891 Unspecified atrial fibrillation: Secondary | ICD-10-CM | POA: Diagnosis not present

## 2017-09-23 DIAGNOSIS — M48062 Spinal stenosis, lumbar region with neurogenic claudication: Secondary | ICD-10-CM | POA: Diagnosis not present

## 2017-09-23 DIAGNOSIS — Z0000 Encounter for general adult medical examination without abnormal findings: Secondary | ICD-10-CM | POA: Diagnosis not present

## 2017-09-23 DIAGNOSIS — I480 Paroxysmal atrial fibrillation: Secondary | ICD-10-CM | POA: Diagnosis not present

## 2017-09-23 DIAGNOSIS — I10 Essential (primary) hypertension: Secondary | ICD-10-CM | POA: Diagnosis not present

## 2017-09-23 DIAGNOSIS — M150 Primary generalized (osteo)arthritis: Secondary | ICD-10-CM | POA: Diagnosis not present

## 2017-09-30 DIAGNOSIS — D500 Iron deficiency anemia secondary to blood loss (chronic): Secondary | ICD-10-CM | POA: Diagnosis not present

## 2017-09-30 DIAGNOSIS — E7800 Pure hypercholesterolemia, unspecified: Secondary | ICD-10-CM | POA: Diagnosis not present

## 2017-09-30 DIAGNOSIS — I480 Paroxysmal atrial fibrillation: Secondary | ICD-10-CM | POA: Diagnosis not present

## 2017-10-12 DIAGNOSIS — H2511 Age-related nuclear cataract, right eye: Secondary | ICD-10-CM | POA: Diagnosis not present

## 2018-01-28 DIAGNOSIS — M48062 Spinal stenosis, lumbar region with neurogenic claudication: Secondary | ICD-10-CM | POA: Diagnosis not present

## 2018-01-28 DIAGNOSIS — I10 Essential (primary) hypertension: Secondary | ICD-10-CM | POA: Diagnosis not present

## 2018-01-28 DIAGNOSIS — M150 Primary generalized (osteo)arthritis: Secondary | ICD-10-CM | POA: Diagnosis not present

## 2018-01-28 DIAGNOSIS — Z0000 Encounter for general adult medical examination without abnormal findings: Secondary | ICD-10-CM | POA: Diagnosis not present

## 2018-01-28 DIAGNOSIS — E7800 Pure hypercholesterolemia, unspecified: Secondary | ICD-10-CM | POA: Diagnosis not present

## 2018-01-28 DIAGNOSIS — I480 Paroxysmal atrial fibrillation: Secondary | ICD-10-CM | POA: Diagnosis not present

## 2018-06-09 DIAGNOSIS — I10 Essential (primary) hypertension: Secondary | ICD-10-CM | POA: Diagnosis not present

## 2018-06-09 DIAGNOSIS — M48062 Spinal stenosis, lumbar region with neurogenic claudication: Secondary | ICD-10-CM | POA: Diagnosis not present

## 2018-06-09 DIAGNOSIS — I480 Paroxysmal atrial fibrillation: Secondary | ICD-10-CM | POA: Diagnosis not present

## 2018-06-09 DIAGNOSIS — E7800 Pure hypercholesterolemia, unspecified: Secondary | ICD-10-CM | POA: Diagnosis not present

## 2018-06-09 DIAGNOSIS — M150 Primary generalized (osteo)arthritis: Secondary | ICD-10-CM | POA: Diagnosis not present

## 2018-06-16 DIAGNOSIS — R29898 Other symptoms and signs involving the musculoskeletal system: Secondary | ICD-10-CM | POA: Diagnosis not present

## 2018-06-16 DIAGNOSIS — N183 Chronic kidney disease, stage 3 (moderate): Secondary | ICD-10-CM | POA: Diagnosis not present

## 2018-06-16 DIAGNOSIS — I10 Essential (primary) hypertension: Secondary | ICD-10-CM | POA: Diagnosis not present

## 2018-06-16 DIAGNOSIS — N3943 Post-void dribbling: Secondary | ICD-10-CM | POA: Diagnosis not present

## 2018-06-16 DIAGNOSIS — Z0000 Encounter for general adult medical examination without abnormal findings: Secondary | ICD-10-CM | POA: Diagnosis not present

## 2018-06-16 DIAGNOSIS — I480 Paroxysmal atrial fibrillation: Secondary | ICD-10-CM | POA: Diagnosis not present

## 2018-06-16 DIAGNOSIS — M150 Primary generalized (osteo)arthritis: Secondary | ICD-10-CM | POA: Diagnosis not present

## 2018-06-16 DIAGNOSIS — M48062 Spinal stenosis, lumbar region with neurogenic claudication: Secondary | ICD-10-CM | POA: Diagnosis not present

## 2018-06-30 DIAGNOSIS — I10 Essential (primary) hypertension: Secondary | ICD-10-CM | POA: Diagnosis not present

## 2018-06-30 DIAGNOSIS — I7300 Raynaud's syndrome without gangrene: Secondary | ICD-10-CM | POA: Diagnosis not present

## 2018-06-30 DIAGNOSIS — D649 Anemia, unspecified: Secondary | ICD-10-CM | POA: Diagnosis not present

## 2018-06-30 DIAGNOSIS — M48062 Spinal stenosis, lumbar region with neurogenic claudication: Secondary | ICD-10-CM | POA: Diagnosis not present

## 2018-06-30 DIAGNOSIS — M353 Polymyalgia rheumatica: Secondary | ICD-10-CM | POA: Diagnosis not present

## 2018-06-30 DIAGNOSIS — I480 Paroxysmal atrial fibrillation: Secondary | ICD-10-CM | POA: Diagnosis not present

## 2018-06-30 DIAGNOSIS — R29898 Other symptoms and signs involving the musculoskeletal system: Secondary | ICD-10-CM | POA: Diagnosis not present

## 2018-06-30 DIAGNOSIS — M170 Bilateral primary osteoarthritis of knee: Secondary | ICD-10-CM | POA: Diagnosis not present

## 2018-06-30 DIAGNOSIS — Z981 Arthrodesis status: Secondary | ICD-10-CM | POA: Diagnosis not present

## 2018-07-06 DIAGNOSIS — D649 Anemia, unspecified: Secondary | ICD-10-CM | POA: Diagnosis not present

## 2018-07-06 DIAGNOSIS — M48062 Spinal stenosis, lumbar region with neurogenic claudication: Secondary | ICD-10-CM | POA: Diagnosis not present

## 2018-07-06 DIAGNOSIS — M170 Bilateral primary osteoarthritis of knee: Secondary | ICD-10-CM | POA: Diagnosis not present

## 2018-07-06 DIAGNOSIS — I10 Essential (primary) hypertension: Secondary | ICD-10-CM | POA: Diagnosis not present

## 2018-07-06 DIAGNOSIS — I480 Paroxysmal atrial fibrillation: Secondary | ICD-10-CM | POA: Diagnosis not present

## 2018-07-06 DIAGNOSIS — M353 Polymyalgia rheumatica: Secondary | ICD-10-CM | POA: Diagnosis not present

## 2018-07-06 DIAGNOSIS — I7300 Raynaud's syndrome without gangrene: Secondary | ICD-10-CM | POA: Diagnosis not present

## 2018-07-06 DIAGNOSIS — R29898 Other symptoms and signs involving the musculoskeletal system: Secondary | ICD-10-CM | POA: Diagnosis not present

## 2018-07-06 DIAGNOSIS — Z981 Arthrodesis status: Secondary | ICD-10-CM | POA: Diagnosis not present

## 2018-07-08 DIAGNOSIS — M170 Bilateral primary osteoarthritis of knee: Secondary | ICD-10-CM | POA: Diagnosis not present

## 2018-07-08 DIAGNOSIS — I10 Essential (primary) hypertension: Secondary | ICD-10-CM | POA: Diagnosis not present

## 2018-07-08 DIAGNOSIS — R29898 Other symptoms and signs involving the musculoskeletal system: Secondary | ICD-10-CM | POA: Diagnosis not present

## 2018-07-08 DIAGNOSIS — M48062 Spinal stenosis, lumbar region with neurogenic claudication: Secondary | ICD-10-CM | POA: Diagnosis not present

## 2018-07-08 DIAGNOSIS — Z981 Arthrodesis status: Secondary | ICD-10-CM | POA: Diagnosis not present

## 2018-07-08 DIAGNOSIS — I7300 Raynaud's syndrome without gangrene: Secondary | ICD-10-CM | POA: Diagnosis not present

## 2018-07-08 DIAGNOSIS — I480 Paroxysmal atrial fibrillation: Secondary | ICD-10-CM | POA: Diagnosis not present

## 2018-07-08 DIAGNOSIS — D649 Anemia, unspecified: Secondary | ICD-10-CM | POA: Diagnosis not present

## 2018-07-08 DIAGNOSIS — M353 Polymyalgia rheumatica: Secondary | ICD-10-CM | POA: Diagnosis not present

## 2018-07-13 DIAGNOSIS — I7300 Raynaud's syndrome without gangrene: Secondary | ICD-10-CM | POA: Diagnosis not present

## 2018-07-13 DIAGNOSIS — M48062 Spinal stenosis, lumbar region with neurogenic claudication: Secondary | ICD-10-CM | POA: Diagnosis not present

## 2018-07-13 DIAGNOSIS — I480 Paroxysmal atrial fibrillation: Secondary | ICD-10-CM | POA: Diagnosis not present

## 2018-07-13 DIAGNOSIS — I10 Essential (primary) hypertension: Secondary | ICD-10-CM | POA: Diagnosis not present

## 2018-07-13 DIAGNOSIS — M353 Polymyalgia rheumatica: Secondary | ICD-10-CM | POA: Diagnosis not present

## 2018-07-13 DIAGNOSIS — D649 Anemia, unspecified: Secondary | ICD-10-CM | POA: Diagnosis not present

## 2018-07-13 DIAGNOSIS — M170 Bilateral primary osteoarthritis of knee: Secondary | ICD-10-CM | POA: Diagnosis not present

## 2018-07-13 DIAGNOSIS — Z981 Arthrodesis status: Secondary | ICD-10-CM | POA: Diagnosis not present

## 2018-07-13 DIAGNOSIS — R29898 Other symptoms and signs involving the musculoskeletal system: Secondary | ICD-10-CM | POA: Diagnosis not present

## 2018-07-13 IMAGING — MR MR LUMBAR SPINE W/O CM
4 of 5 series · 14 of 48 positions shown · non-contrast
Comparison: Report of [REDACTED] lumbar radiographs 04/10/2014
(no images available).

CLINICAL DATA: 89-year-old male with lumbar back pain associated
with bilateral leg numbness and right leg burning sensation for 2
years. Lumbar radiculopathy.

EXAM:
MRI LUMBAR SPINE WITHOUT CONTRAST
TECHNIQUE: Multiplanar, multisequence MR imaging of the lumbar spine was
performed. No intravenous contrast was administered.

[Series 3: T2 · sagittal · 4.0mm · 0.44mm/px · 5 of 19 slices shown (1 of 2)]
[im 1/19]
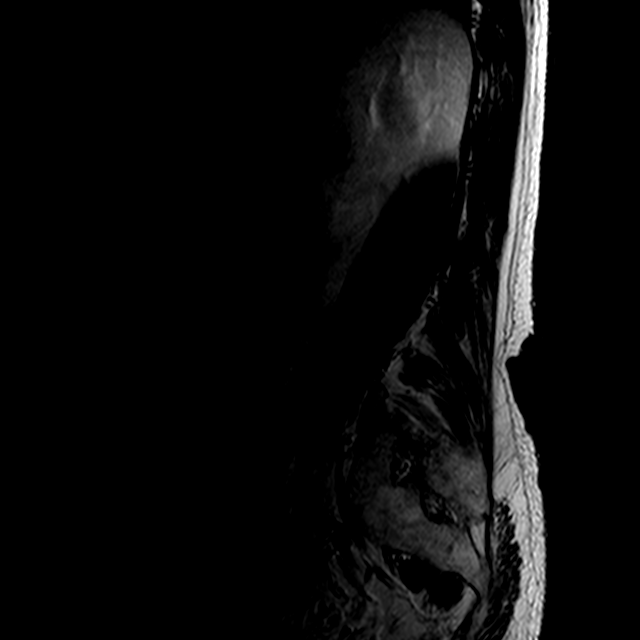
[im 3/19]
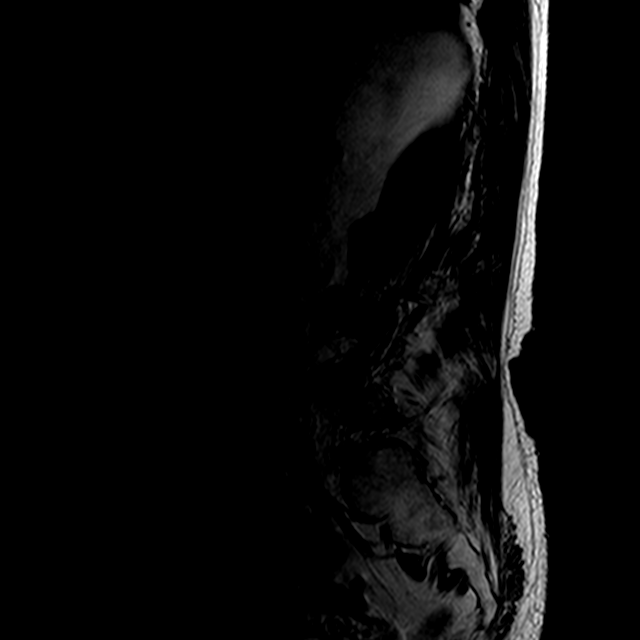
[im 6/19]
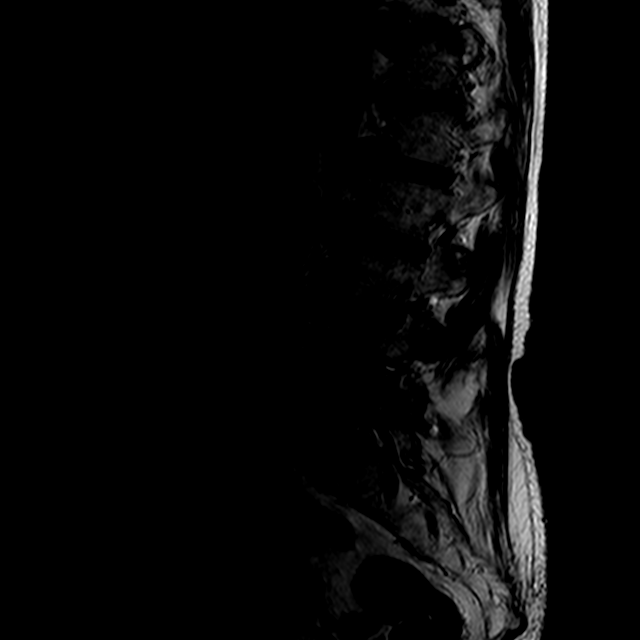
[im 11/19]
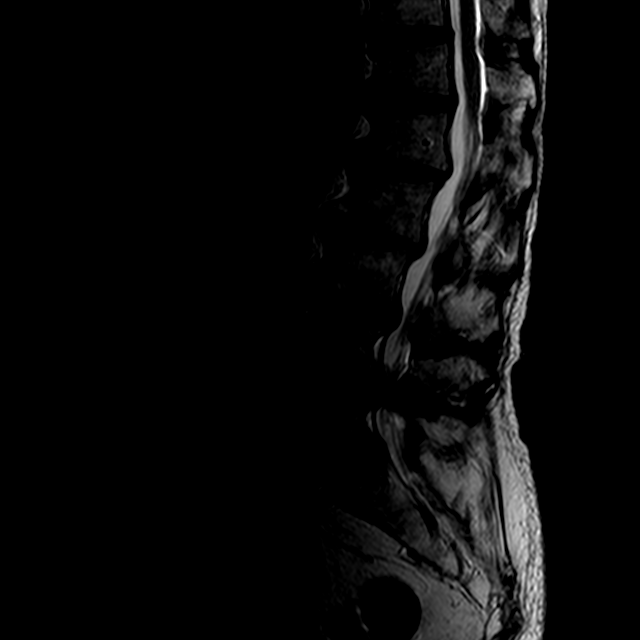
[im 16/19]
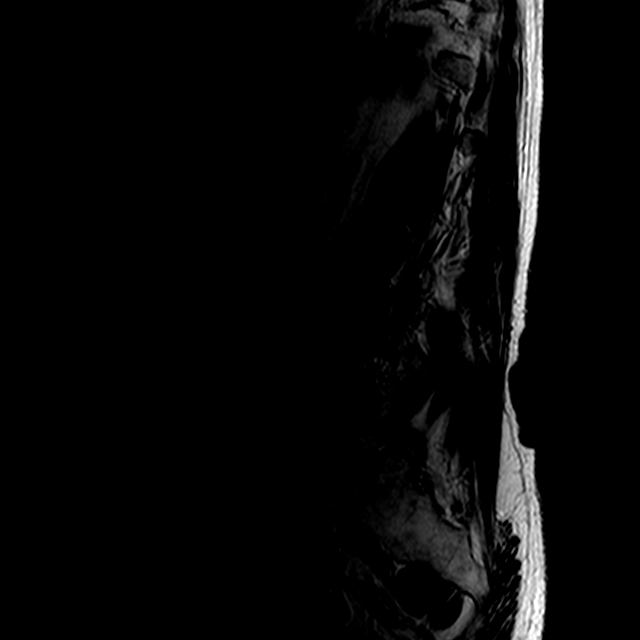

[Series 4: T1 · sagittal · 4.0mm · 0.44mm/px · 3 of 19 slices shown (1 of 2)]
[im 4/19]
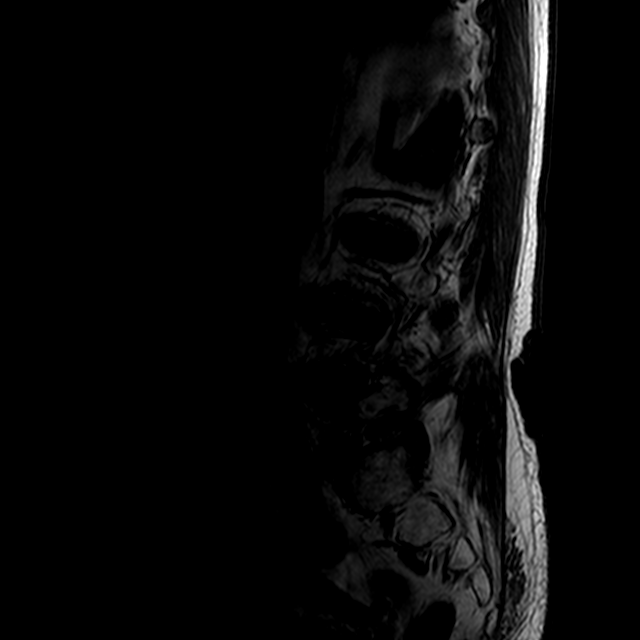
[im 10/19]
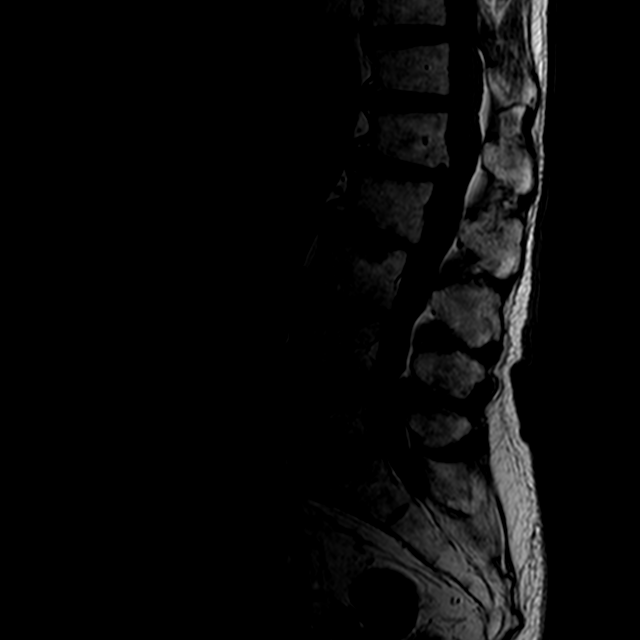
[im 16/19]
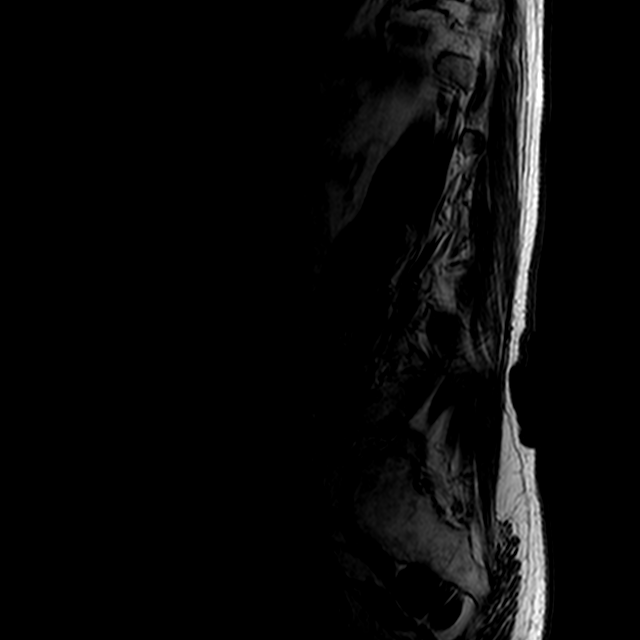

[Series 6: T2 · axial · 4.0mm · 0.39mm/px · z∈[-115,+19]mm · 3 of 35 slices shown (2 of 2)]
[im 6/35]
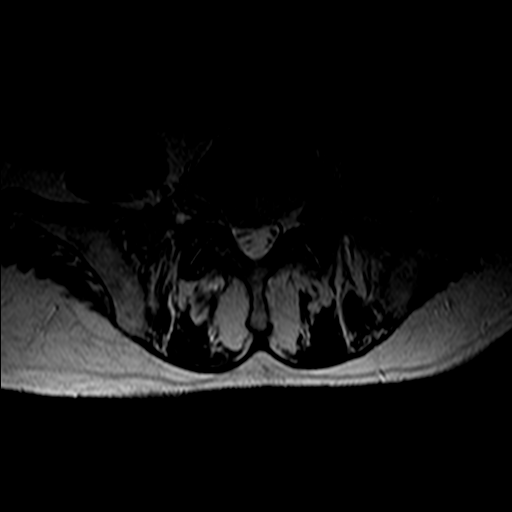
[im 18/35]
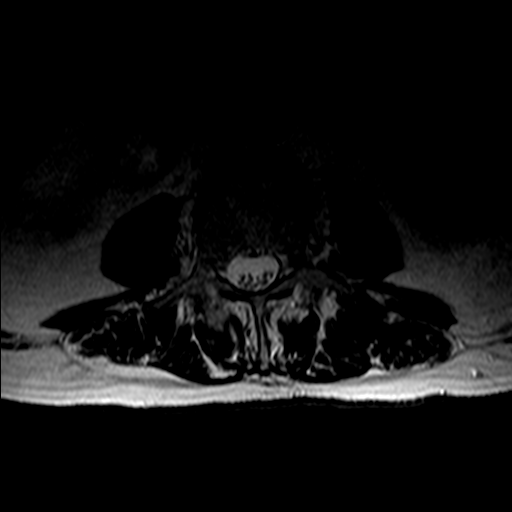
[im 29/35]
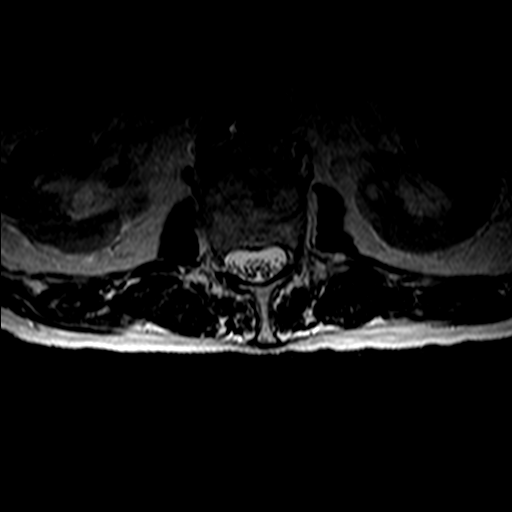

[Series 7: T1 · axial · 4.0mm · 0.39mm/px · z∈[-115,+19]mm · 3 of 35 slices shown (2 of 2)]
[im 6/35]
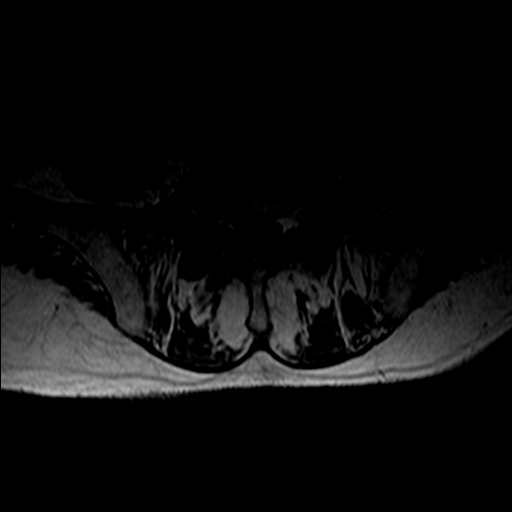
[im 18/35]
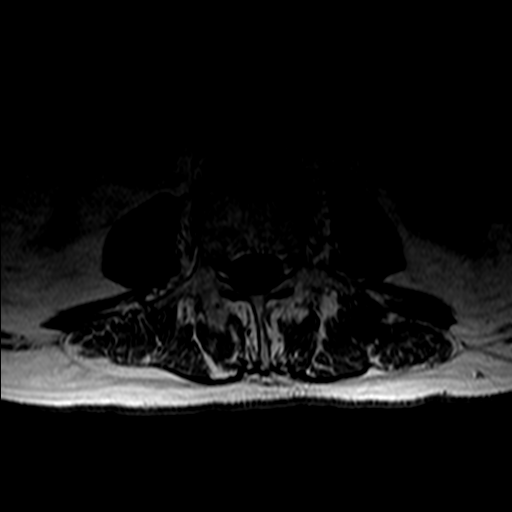
[im 29/35]
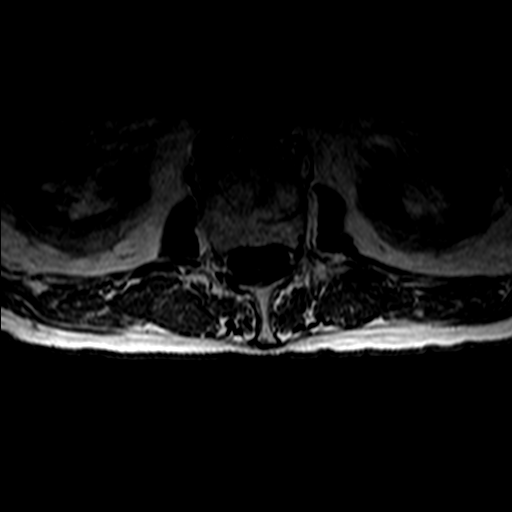

[14 of 48 positions shown; findings below may reference images not displayed]

FINDINGS: Segmentation: Lumbar segmentation appears to be normal and will be
designated as such for this report.

Alignment: Subtle grade 1 anterolisthesis of L5 on S1. Mildly
exaggerated lower lumbar lordosis and mild straightening of upper
lumbar lordosis. Mild retrolisthesis of L1 on L2.

Vertebrae: Mild degenerative endplate marrow edema posteriorly on
the right at the L4-L5 endplates and posteriorly in the midline at
the L1 inferior endplate. Otherwise normal bone marrow signal.
Visible sacrum and SI joints appear intact.

Conus medullaris: Extends to the L1 level and appears normal.

Paraspinal and other soft tissues: Negative.

Disc levels:

T11-12: Mild disc bulge. Moderate to severe facet hypertrophy. Left
side facet joint fluid. Mild bilateral T11 foraminal stenosis.

T12-L1: Mild disc bulge. Mild ligament flavum hypertrophy. No
stenosis.

L1-L2: Mild circumferential disc bulge and endplate spurring with
broad-based posterior component. Mild left facet hypertrophy. Mild
left L1 foraminal stenosis.

L2-L3: Circumferential disc bulge with broad-based posterior
component. Mild facet and ligament flavum hypertrophy. Mild
bilateral L2 foraminal stenosis greater on the left.

L3-L4: Circumferential disc bulge with broad-based posterior
component. Moderate facet and ligament flavum hypertrophy. Mild
spinal and moderate left lateral recess stenosis (series 6, image
21). Moderate bilateral L3 foraminal stenosis.

L4-L5: Circumferential disc bulge with superimposed broad-based
central disc protrusion. Severe facet and ligament flavum
hypertrophy. Abnormal signal in the interspinous ligament (series 5,
image 10) appears degenerative in nature and is associated with
small posteriorly situated synovial cysts. Severe spinal and right
greater than left lateral recess stenosis (series 6, image 27). Mild
left and moderate to severe right L4 foraminal stenosis (series 3,
image 6).

L5-S1: Circumferential disc bulge with broad-based posterior
component. Moderate facet hypertrophy. No spinal stenosis.
Borderline to mild bilateral lateral recess stenosis. Mild right L5
foraminal stenosis.
IMPRESSION: 1. L4-L5 Severe spinal stenosis, right greater than left lateral
recess stenosis, and right neural foraminal stenosis secondary to
bulky disc and posterior element degeneration. Developing [place]
disease also suspected at this level.
2. L3-L4 mild spinal and up to moderate left lateral recess and
bilateral foraminal stenosis.
3. Intermittent mild lumbar stenosis otherwise.

## 2018-07-15 DIAGNOSIS — I7300 Raynaud's syndrome without gangrene: Secondary | ICD-10-CM | POA: Diagnosis not present

## 2018-07-15 DIAGNOSIS — M353 Polymyalgia rheumatica: Secondary | ICD-10-CM | POA: Diagnosis not present

## 2018-07-15 DIAGNOSIS — R29898 Other symptoms and signs involving the musculoskeletal system: Secondary | ICD-10-CM | POA: Diagnosis not present

## 2018-07-15 DIAGNOSIS — M170 Bilateral primary osteoarthritis of knee: Secondary | ICD-10-CM | POA: Diagnosis not present

## 2018-07-15 DIAGNOSIS — D649 Anemia, unspecified: Secondary | ICD-10-CM | POA: Diagnosis not present

## 2018-07-15 DIAGNOSIS — I10 Essential (primary) hypertension: Secondary | ICD-10-CM | POA: Diagnosis not present

## 2018-07-15 DIAGNOSIS — Z981 Arthrodesis status: Secondary | ICD-10-CM | POA: Diagnosis not present

## 2018-07-15 DIAGNOSIS — I480 Paroxysmal atrial fibrillation: Secondary | ICD-10-CM | POA: Diagnosis not present

## 2018-07-15 DIAGNOSIS — M48062 Spinal stenosis, lumbar region with neurogenic claudication: Secondary | ICD-10-CM | POA: Diagnosis not present

## 2018-07-20 DIAGNOSIS — M48062 Spinal stenosis, lumbar region with neurogenic claudication: Secondary | ICD-10-CM | POA: Diagnosis not present

## 2018-07-20 DIAGNOSIS — I10 Essential (primary) hypertension: Secondary | ICD-10-CM | POA: Diagnosis not present

## 2018-07-20 DIAGNOSIS — I7300 Raynaud's syndrome without gangrene: Secondary | ICD-10-CM | POA: Diagnosis not present

## 2018-07-20 DIAGNOSIS — D649 Anemia, unspecified: Secondary | ICD-10-CM | POA: Diagnosis not present

## 2018-07-20 DIAGNOSIS — M170 Bilateral primary osteoarthritis of knee: Secondary | ICD-10-CM | POA: Diagnosis not present

## 2018-07-20 DIAGNOSIS — Z981 Arthrodesis status: Secondary | ICD-10-CM | POA: Diagnosis not present

## 2018-07-20 DIAGNOSIS — M353 Polymyalgia rheumatica: Secondary | ICD-10-CM | POA: Diagnosis not present

## 2018-07-20 DIAGNOSIS — I480 Paroxysmal atrial fibrillation: Secondary | ICD-10-CM | POA: Diagnosis not present

## 2018-07-20 DIAGNOSIS — R29898 Other symptoms and signs involving the musculoskeletal system: Secondary | ICD-10-CM | POA: Diagnosis not present

## 2018-07-22 DIAGNOSIS — M170 Bilateral primary osteoarthritis of knee: Secondary | ICD-10-CM | POA: Diagnosis not present

## 2018-07-22 DIAGNOSIS — M353 Polymyalgia rheumatica: Secondary | ICD-10-CM | POA: Diagnosis not present

## 2018-07-22 DIAGNOSIS — I7300 Raynaud's syndrome without gangrene: Secondary | ICD-10-CM | POA: Diagnosis not present

## 2018-07-22 DIAGNOSIS — I480 Paroxysmal atrial fibrillation: Secondary | ICD-10-CM | POA: Diagnosis not present

## 2018-07-22 DIAGNOSIS — D649 Anemia, unspecified: Secondary | ICD-10-CM | POA: Diagnosis not present

## 2018-07-22 DIAGNOSIS — R29898 Other symptoms and signs involving the musculoskeletal system: Secondary | ICD-10-CM | POA: Diagnosis not present

## 2018-07-22 DIAGNOSIS — Z981 Arthrodesis status: Secondary | ICD-10-CM | POA: Diagnosis not present

## 2018-07-22 DIAGNOSIS — I10 Essential (primary) hypertension: Secondary | ICD-10-CM | POA: Diagnosis not present

## 2018-07-22 DIAGNOSIS — M48062 Spinal stenosis, lumbar region with neurogenic claudication: Secondary | ICD-10-CM | POA: Diagnosis not present

## 2018-07-27 DIAGNOSIS — M48062 Spinal stenosis, lumbar region with neurogenic claudication: Secondary | ICD-10-CM | POA: Diagnosis not present

## 2018-07-27 DIAGNOSIS — I10 Essential (primary) hypertension: Secondary | ICD-10-CM | POA: Diagnosis not present

## 2018-07-27 DIAGNOSIS — M353 Polymyalgia rheumatica: Secondary | ICD-10-CM | POA: Diagnosis not present

## 2018-07-27 DIAGNOSIS — D649 Anemia, unspecified: Secondary | ICD-10-CM | POA: Diagnosis not present

## 2018-07-27 DIAGNOSIS — I480 Paroxysmal atrial fibrillation: Secondary | ICD-10-CM | POA: Diagnosis not present

## 2018-07-27 DIAGNOSIS — R29898 Other symptoms and signs involving the musculoskeletal system: Secondary | ICD-10-CM | POA: Diagnosis not present

## 2018-07-27 DIAGNOSIS — I7300 Raynaud's syndrome without gangrene: Secondary | ICD-10-CM | POA: Diagnosis not present

## 2018-07-27 DIAGNOSIS — Z981 Arthrodesis status: Secondary | ICD-10-CM | POA: Diagnosis not present

## 2018-07-27 DIAGNOSIS — M170 Bilateral primary osteoarthritis of knee: Secondary | ICD-10-CM | POA: Diagnosis not present

## 2018-07-29 DIAGNOSIS — M48062 Spinal stenosis, lumbar region with neurogenic claudication: Secondary | ICD-10-CM | POA: Diagnosis not present

## 2018-07-29 DIAGNOSIS — I10 Essential (primary) hypertension: Secondary | ICD-10-CM | POA: Diagnosis not present

## 2018-07-29 DIAGNOSIS — I480 Paroxysmal atrial fibrillation: Secondary | ICD-10-CM | POA: Diagnosis not present

## 2018-07-29 DIAGNOSIS — I7300 Raynaud's syndrome without gangrene: Secondary | ICD-10-CM | POA: Diagnosis not present

## 2018-07-29 DIAGNOSIS — D649 Anemia, unspecified: Secondary | ICD-10-CM | POA: Diagnosis not present

## 2018-07-29 DIAGNOSIS — M170 Bilateral primary osteoarthritis of knee: Secondary | ICD-10-CM | POA: Diagnosis not present

## 2018-07-29 DIAGNOSIS — M353 Polymyalgia rheumatica: Secondary | ICD-10-CM | POA: Diagnosis not present

## 2018-07-29 DIAGNOSIS — R29898 Other symptoms and signs involving the musculoskeletal system: Secondary | ICD-10-CM | POA: Diagnosis not present

## 2018-07-29 DIAGNOSIS — Z981 Arthrodesis status: Secondary | ICD-10-CM | POA: Diagnosis not present

## 2018-08-03 DIAGNOSIS — M353 Polymyalgia rheumatica: Secondary | ICD-10-CM | POA: Diagnosis not present

## 2018-08-03 DIAGNOSIS — D649 Anemia, unspecified: Secondary | ICD-10-CM | POA: Diagnosis not present

## 2018-08-03 DIAGNOSIS — I7300 Raynaud's syndrome without gangrene: Secondary | ICD-10-CM | POA: Diagnosis not present

## 2018-08-03 DIAGNOSIS — R29898 Other symptoms and signs involving the musculoskeletal system: Secondary | ICD-10-CM | POA: Diagnosis not present

## 2018-08-03 DIAGNOSIS — M48062 Spinal stenosis, lumbar region with neurogenic claudication: Secondary | ICD-10-CM | POA: Diagnosis not present

## 2018-08-03 DIAGNOSIS — Z981 Arthrodesis status: Secondary | ICD-10-CM | POA: Diagnosis not present

## 2018-08-03 DIAGNOSIS — I10 Essential (primary) hypertension: Secondary | ICD-10-CM | POA: Diagnosis not present

## 2018-08-03 DIAGNOSIS — M170 Bilateral primary osteoarthritis of knee: Secondary | ICD-10-CM | POA: Diagnosis not present

## 2018-08-03 DIAGNOSIS — I480 Paroxysmal atrial fibrillation: Secondary | ICD-10-CM | POA: Diagnosis not present

## 2018-12-22 DIAGNOSIS — M5441 Lumbago with sciatica, right side: Secondary | ICD-10-CM | POA: Diagnosis not present

## 2018-12-22 DIAGNOSIS — M48062 Spinal stenosis, lumbar region with neurogenic claudication: Secondary | ICD-10-CM | POA: Diagnosis not present

## 2018-12-22 DIAGNOSIS — N183 Chronic kidney disease, stage 3 (moderate): Secondary | ICD-10-CM | POA: Diagnosis not present

## 2018-12-22 DIAGNOSIS — G8929 Other chronic pain: Secondary | ICD-10-CM | POA: Diagnosis not present

## 2018-12-22 DIAGNOSIS — M5442 Lumbago with sciatica, left side: Secondary | ICD-10-CM | POA: Diagnosis not present

## 2018-12-22 DIAGNOSIS — I480 Paroxysmal atrial fibrillation: Secondary | ICD-10-CM | POA: Diagnosis not present

## 2018-12-22 DIAGNOSIS — D500 Iron deficiency anemia secondary to blood loss (chronic): Secondary | ICD-10-CM | POA: Diagnosis not present

## 2019-01-10 IMAGING — RF DG LUMBAR SPINE 2-3V
1 series · 1 of 1 positions shown · non-contrast
Comparison: Lumbar MRI 01/15/2017

CLINICAL DATA: Back surgery

EXAM:
LUMBAR SPINE - 2-3 VIEW; DG C-ARM 61-120 MIN

[Series 1: run · 1 of 1 slices shown]
[im 1/1]
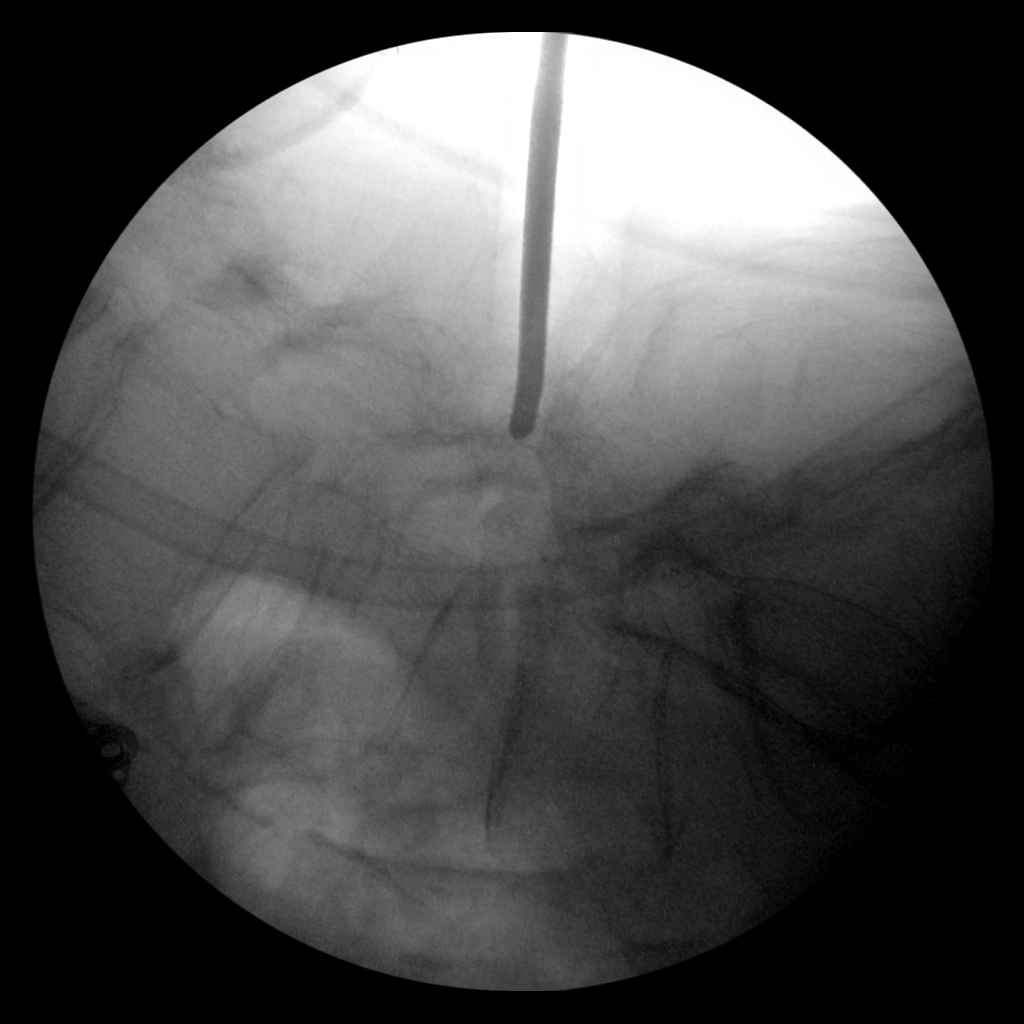

[1 of 1 positions shown; findings below may reference images not displayed]

FINDINGS: Single lateral C-arm image of the lumbar spine was obtained.
Localizing instrument is present posteriorly at what appears to be
the L4-5 level. Suboptimal image.
IMPRESSION: Lumbar localization the operating room. Surgical instrument
posteriorly appears to be at L4-5 with limited anatomic detail.

## 2019-03-14 DIAGNOSIS — M8949 Other hypertrophic osteoarthropathy, multiple sites: Secondary | ICD-10-CM | POA: Diagnosis not present

## 2019-03-14 DIAGNOSIS — N183 Chronic kidney disease, stage 3 (moderate): Secondary | ICD-10-CM | POA: Diagnosis not present

## 2019-03-14 DIAGNOSIS — M5441 Lumbago with sciatica, right side: Secondary | ICD-10-CM | POA: Diagnosis not present

## 2019-03-14 DIAGNOSIS — E7800 Pure hypercholesterolemia, unspecified: Secondary | ICD-10-CM | POA: Diagnosis not present

## 2019-03-14 DIAGNOSIS — M5442 Lumbago with sciatica, left side: Secondary | ICD-10-CM | POA: Diagnosis not present

## 2019-03-14 DIAGNOSIS — M48062 Spinal stenosis, lumbar region with neurogenic claudication: Secondary | ICD-10-CM | POA: Diagnosis not present

## 2019-03-14 DIAGNOSIS — G8929 Other chronic pain: Secondary | ICD-10-CM | POA: Diagnosis not present

## 2019-03-14 DIAGNOSIS — I10 Essential (primary) hypertension: Secondary | ICD-10-CM | POA: Diagnosis not present

## 2019-03-21 DIAGNOSIS — Z0000 Encounter for general adult medical examination without abnormal findings: Secondary | ICD-10-CM | POA: Diagnosis not present

## 2019-03-21 DIAGNOSIS — M48062 Spinal stenosis, lumbar region with neurogenic claudication: Secondary | ICD-10-CM | POA: Diagnosis not present

## 2019-03-21 DIAGNOSIS — I480 Paroxysmal atrial fibrillation: Secondary | ICD-10-CM | POA: Diagnosis not present

## 2019-03-21 DIAGNOSIS — M8949 Other hypertrophic osteoarthropathy, multiple sites: Secondary | ICD-10-CM | POA: Diagnosis not present

## 2019-03-21 DIAGNOSIS — M353 Polymyalgia rheumatica: Secondary | ICD-10-CM | POA: Diagnosis not present

## 2019-03-21 DIAGNOSIS — I10 Essential (primary) hypertension: Secondary | ICD-10-CM | POA: Diagnosis not present

## 2019-03-21 DIAGNOSIS — E7800 Pure hypercholesterolemia, unspecified: Secondary | ICD-10-CM | POA: Diagnosis not present

## 2019-04-29 DIAGNOSIS — H35372 Puckering of macula, left eye: Secondary | ICD-10-CM | POA: Diagnosis not present

## 2019-05-02 DIAGNOSIS — H2589 Other age-related cataract: Secondary | ICD-10-CM | POA: Diagnosis not present

## 2019-05-02 DIAGNOSIS — I499 Cardiac arrhythmia, unspecified: Secondary | ICD-10-CM | POA: Diagnosis not present

## 2019-05-04 ENCOUNTER — Encounter: Payer: Self-pay | Admitting: *Deleted

## 2019-05-04 ENCOUNTER — Other Ambulatory Visit: Payer: Self-pay

## 2019-05-06 ENCOUNTER — Other Ambulatory Visit
Admission: RE | Admit: 2019-05-06 | Discharge: 2019-05-06 | Disposition: A | Discharge status: Home / Self Care | Payer: Medicare HMO | Source: Ambulatory Visit | Attending: Ophthalmology | Admitting: Ophthalmology

## 2019-05-06 ENCOUNTER — Other Ambulatory Visit: Payer: Self-pay

## 2019-05-06 DIAGNOSIS — H2589 Other age-related cataract: Secondary | ICD-10-CM | POA: Insufficient documentation

## 2019-05-06 DIAGNOSIS — Z20828 Contact with and (suspected) exposure to other viral communicable diseases: Secondary | ICD-10-CM | POA: Diagnosis not present

## 2019-05-06 DIAGNOSIS — Z01812 Encounter for preprocedural laboratory examination: Secondary | ICD-10-CM | POA: Diagnosis not present

## 2019-05-06 LAB — SARS CORONAVIRUS 2 (TAT 6-24 HRS): SARS Coronavirus 2: NEGATIVE

## 2019-05-06 NOTE — Discharge Instructions (Signed)

## 2019-05-11 ENCOUNTER — Other Ambulatory Visit: Payer: Self-pay

## 2019-05-11 ENCOUNTER — Encounter
Admission: RE | Disposition: A | Discharge status: Home / Self Care | Payer: Self-pay | Source: Home / Self Care | Attending: Ophthalmology

## 2019-05-11 ENCOUNTER — Ambulatory Visit
Admission: RE | Admit: 2019-05-11 | Discharge: 2019-05-11 | Disposition: A | Discharge status: Home / Self Care | Payer: Medicare HMO | Attending: Ophthalmology | Admitting: Ophthalmology

## 2019-05-11 ENCOUNTER — Ambulatory Visit: Payer: Medicare HMO | Admitting: Anesthesiology

## 2019-05-11 DIAGNOSIS — H5703 Miosis: Secondary | ICD-10-CM | POA: Insufficient documentation

## 2019-05-11 DIAGNOSIS — I10 Essential (primary) hypertension: Secondary | ICD-10-CM | POA: Insufficient documentation

## 2019-05-11 DIAGNOSIS — Z79899 Other long term (current) drug therapy: Secondary | ICD-10-CM | POA: Insufficient documentation

## 2019-05-11 DIAGNOSIS — Z87891 Personal history of nicotine dependence: Secondary | ICD-10-CM | POA: Insufficient documentation

## 2019-05-11 DIAGNOSIS — K219 Gastro-esophageal reflux disease without esophagitis: Secondary | ICD-10-CM | POA: Insufficient documentation

## 2019-05-11 DIAGNOSIS — G629 Polyneuropathy, unspecified: Secondary | ICD-10-CM | POA: Insufficient documentation

## 2019-05-11 DIAGNOSIS — H2589 Other age-related cataract: Secondary | ICD-10-CM | POA: Diagnosis not present

## 2019-05-11 DIAGNOSIS — H2511 Age-related nuclear cataract, right eye: Secondary | ICD-10-CM | POA: Diagnosis not present

## 2019-05-11 DIAGNOSIS — I4891 Unspecified atrial fibrillation: Secondary | ICD-10-CM | POA: Insufficient documentation

## 2019-05-11 DIAGNOSIS — Z9181 History of falling: Secondary | ICD-10-CM | POA: Insufficient documentation

## 2019-05-11 DIAGNOSIS — H25811 Combined forms of age-related cataract, right eye: Secondary | ICD-10-CM | POA: Diagnosis not present

## 2019-05-11 HISTORY — PX: CATARACT EXTRACTION W/PHACO: SHX586

## 2019-05-11 HISTORY — DX: Paresthesia of skin: R20.0

## 2019-05-11 HISTORY — DX: Gastro-esophageal reflux disease without esophagitis: K21.9

## 2019-05-11 HISTORY — DX: Presence of external hearing-aid: Z97.4

## 2019-05-11 SURGERY — PHACOEMULSIFICATION, CATARACT, WITH IOL INSERTION
Anesthesia: Monitor Anesthesia Care | Site: Eye | Laterality: Right

## 2019-05-11 MED ORDER — MIDAZOLAM HCL 2 MG/2ML IJ SOLN
INTRAMUSCULAR | Status: DC | PRN
  Administered 2019-05-11: 1 mg via INTRAVENOUS

## 2019-05-11 MED ORDER — MOXIFLOXACIN HCL 0.5 % OP SOLN
1.0000 [drp] | OPHTHALMIC | Status: DC | PRN
  Administered 2019-05-11 (×3): 1 [drp] via OPHTHALMIC

## 2019-05-11 MED ORDER — ACETAMINOPHEN 160 MG/5ML PO SOLN: 325.0000 mg | ORAL | Status: DC | PRN

## 2019-05-11 MED ORDER — TRYPAN BLUE 0.06 % OP SOLN
OPHTHALMIC | Status: DC | PRN
  Administered 2019-05-11: 0.5 mL via INTRAOCULAR

## 2019-05-11 MED ORDER — CEFUROXIME OPHTHALMIC INJECTION 1 MG/0.1 ML
INJECTION | OPHTHALMIC | Status: DC | PRN
  Administered 2019-05-11: 0.1 mL via INTRACAMERAL

## 2019-05-11 MED ORDER — LIDOCAINE HCL (PF) 2 % IJ SOLN
INTRAOCULAR | Status: DC | PRN
  Administered 2019-05-11: 1 mL

## 2019-05-11 MED ORDER — NA HYALUR & NA CHOND-NA HYALUR 0.4-0.35 ML IO KIT
PACK | INTRAOCULAR | Status: DC | PRN
  Administered 2019-05-11: 1 mL via INTRAOCULAR

## 2019-05-11 MED ORDER — FENTANYL CITRATE (PF) 100 MCG/2ML IJ SOLN
INTRAMUSCULAR | Status: DC | PRN
  Administered 2019-05-11: 50 ug via INTRAVENOUS

## 2019-05-11 MED ORDER — BRIMONIDINE TARTRATE-TIMOLOL 0.2-0.5 % OP SOLN
OPHTHALMIC | Status: DC | PRN
  Administered 2019-05-11: 1 [drp] via OPHTHALMIC

## 2019-05-11 MED ORDER — EPINEPHRINE PF 1 MG/ML IJ SOLN
INTRAOCULAR | Status: DC | PRN
  Administered 2019-05-11: 11:00:00 151 mL via OPHTHALMIC

## 2019-05-11 MED ORDER — ACETAMINOPHEN 325 MG PO TABS: 325.0000 mg | ORAL_TABLET | ORAL | Status: DC | PRN

## 2019-05-11 MED ORDER — ARMC OPHTHALMIC DILATING DROPS
1.0000 "application " | OPHTHALMIC | Status: DC | PRN
  Administered 2019-05-11 (×3): 1 via OPHTHALMIC

## 2019-05-11 MED ORDER — SODIUM HYALURONATE 23 MG/ML IO SOLN
INTRAOCULAR | Status: DC | PRN
  Administered 2019-05-11: 0.6 mL via INTRAOCULAR

## 2019-05-11 MED ORDER — TETRACAINE HCL 0.5 % OP SOLN
1.0000 [drp] | OPHTHALMIC | Status: DC | PRN
  Administered 2019-05-11 (×3): 1 [drp] via OPHTHALMIC

## 2019-05-11 SURGICAL SUPPLY — 18 items
CANNULA ANT/CHMB 27G (MISCELLANEOUS) ×1 IMPLANT
CANNULA ANT/CHMB 27GA (MISCELLANEOUS) ×3 IMPLANT
GLOVE SURG LX 7.5 STRW (GLOVE) ×2
GLOVE SURG LX STRL 7.5 STRW (GLOVE) ×1 IMPLANT
GLOVE SURG TRIUMPH 8.0 PF LTX (GLOVE) ×3 IMPLANT
GOWN STRL REUS W/ TWL LRG LVL3 (GOWN DISPOSABLE) ×2 IMPLANT
GOWN STRL REUS W/TWL LRG LVL3 (GOWN DISPOSABLE) ×4
LENS IOL ACRSF IQ ULTRA 16.5 (Intraocular Lens) IMPLANT
LENS IOL ACRYSOF IQ 16.5 (Intraocular Lens) ×3 IMPLANT
MARKER SKIN DUAL TIP RULER LAB (MISCELLANEOUS) ×3 IMPLANT
PACK CATARACT BRASINGTON (MISCELLANEOUS) ×3 IMPLANT
PACK EYE AFTER SURG (MISCELLANEOUS) ×3 IMPLANT
PACK OPTHALMIC (MISCELLANEOUS) ×3 IMPLANT
RING MALYGIN 7.0 (MISCELLANEOUS) ×2 IMPLANT
SYR 3ML LL SCALE MARK (SYRINGE) ×3 IMPLANT
SYR TB 1ML LUER SLIP (SYRINGE) ×3 IMPLANT
WATER STERILE IRR 500ML POUR (IV SOLUTION) ×3 IMPLANT
WIPE NON LINTING 3.25X3.25 (MISCELLANEOUS) ×3 IMPLANT

## 2019-05-11 NOTE — Anesthesia Postprocedure Evaluation (Signed)
Anesthesia Post Note  Patient: William Tapia  Procedure(s) Performed: CATARACT EXTRACTION PHACO AND INTRAOCULAR LENS PLACEMENT (IOC) Right Healon 5 Malyugin Vision Blue  04:06.2  29.9%  73.82 (Right Eye)  Anesthesia Type: MAC    Wanda Plump Luan Urbani

## 2019-05-11 NOTE — H&P (Signed)

## 2019-05-11 NOTE — Progress Notes (Signed)
Patient's glasses and hearing aid provided to son, along with all discharge instructions.

## 2019-05-11 NOTE — Op Note (Signed)
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          PROCEDURE:  Phacoemusification with posterior chamber intraocular lens placement of the right eye which required pupil stretching with the Malyugin pupil expansion device and Vision blue dye to satin the lens capsule.  LENS:   Implant Name Type Inv. Item Serial No. Manufacturer Lot No. LRB No. Used Action  LENS IOL ACRYSOF IQ 16.5 - [id number] Intraocular Lens LENS IOL ACRYSOF IQ 16.5 [id number] ALCON  Right 1 Implanted       ULTRASOUND TIME: 30 % of 4 minutes 6 seconds, CDE 73.8  SURGEON:  Wyonia Hough, MD   ANESTHESIA:  Topical with tetracaine drops and 2% Xylocaine jelly, augmented with 1% preservative-free intracameral lidocaine.   COMPLICATIONS:  None.   DESCRIPTION OF PROCEDURE:  The patient was identified in the holding room and transported to the operating room and placed in the supine position under the operating microscope. Theright eye was identified as the operative eye and it was prepped and draped in the usual sterile ophthalmic fashion.   A 1 millimeter clear-corneal paracentesis was made at the 12:00 position.  0.5 ml of preservative-free 1% lidocaine was injected into the anterior chamber. The anterior chamber was filled with Healon 5 viscoelastic.  Vision blue dye was placed into the anterior chamber.  It was rinsed with balanced salt.  Additional Helon 5 was placed.  A 2.4 millimeter keratome was used to make a near-clear corneal incision at the 9:00 position. A Malyugin pupil expander was then placed through the main incision and into the anterior chamber of the eye.  The edge of the iris was secured on the lip of the pupil expander and it was released, thereby expanding the pupil to approximately 7  millimeters for completion of the cataract surgery.  Additional Viscoat and Healon 5 were placed in the anterior chamber.  A cystotome and capsulorrhexis forceps were used to make a curvilinear capsulorrhexis.   Balanced salt solution was used to hydrodissect and hydrodelineate the lens nucleus.   Phacoemulsification was used in stop and chop fashion to remove the lens, nucleus and epinucleus.  The remaining cortex was aspirated using the irrigation aspiration handpiece.  Additional Provisc was placed into the eye to distend the capsular bag for lens placement.  A lens was then injected into the capsular bag.  The pupil expanding ring was removed using a Kuglen hook and insertion device. The remaining viscoelastic was aspirated from the capsular bag and the anterior chamber.  The anterior chamber was filled with balanced salt solution to inflate to a physiologic pressure.  Wounds were hydrated with balanced salt solution.  The anterior chamber was inflated to a physiologic pressure with balanced salt solution.  No wound leaks were noted.Cefuroxime 0.1 ml of a 10mg /ml solution was injected into the anterior chamber for a dose of 1 mg of intracameral antibiotic at the completion of the case. Timolol and Brimonidine drops were applied to the eye.  The patient was taken to the recovery room in stable condition without complications of anesthesia or surgery.  William Tapia 05/11/2019, 10:45 AM

## 2019-05-11 NOTE — Anesthesia Procedure Notes (Signed)
Procedure Name: MAC Performed by: Georges Victorio, CRNA Pre-anesthesia Checklist: Patient identified, Emergency Drugs available, Suction available, Timeout performed and Patient being monitored Patient Re-evaluated:Patient Re-evaluated prior to induction Oxygen Delivery Method: Nasal cannula Placement Confirmation: positive ETCO2       

## 2019-05-11 NOTE — Transfer of Care (Signed)
Immediate Anesthesia Transfer of Care Note  Patient: William Tapia  Procedure(s) Performed: CATARACT EXTRACTION PHACO AND INTRAOCULAR LENS PLACEMENT (IOC) Right Healon 5 Malyugin Vision Blue  04:06.2  29.9%  73.82 (Right Eye)  Patient Location: PACU  Anesthesia Type: MAC  Level of Consciousness: awake, alert  and patient cooperative  Airway and Oxygen Therapy: Patient Spontanous Breathing and Patient connected to supplemental oxygen  Post-op Assessment: Post-op Vital signs reviewed, Patient's Cardiovascular Status Stable, Respiratory Function Stable, Patent Airway and No signs of Nausea or vomiting  Post-op Vital Signs: Reviewed and stable  Complications: No apparent anesthesia complications

## 2019-05-11 NOTE — Anesthesia Preprocedure Evaluation (Signed)
Anesthesia Evaluation   Patient awake    Reviewed: Allergy & Precautions, NPO status , Patient's Chart, lab work & pertinent test results  Airway Mallampati: II  TM Distance: >3 FB Neck ROM: Full    Dental   Pulmonary former smoker,    Pulmonary exam normal        Cardiovascular hypertension, + dysrhythmias Atrial Fibrillation  Rhythm:Irregular Rate:Normal     Neuro/Psych    GI/Hepatic GERD  Controlled,  Endo/Other    Renal/GU CRFRenal disease     Musculoskeletal   Abdominal   Peds  Hematology   Anesthesia Other Findings   Reproductive/Obstetrics                             Anesthesia Physical Anesthesia Plan  ASA: III  Anesthesia Plan: MAC   Post-op Pain Management:    Induction: Intravenous  PONV Risk Score and Plan:   Airway Management Planned: Nasal Cannula  Additional Equipment:   Intra-op Plan:   Post-operative Plan:   Informed Consent: I have reviewed the patients History and Physical, chart, labs and discussed the procedure including the risks, benefits and alternatives for the proposed anesthesia with the patient or authorized representative who has indicated his/her understanding and acceptance.       Plan Discussed with: CRNA, Anesthesiologist and Surgeon  Anesthesia Plan Comments:         Anesthesia Quick Evaluation

## 2019-05-12 ENCOUNTER — Encounter: Payer: Self-pay | Admitting: Ophthalmology

## 2019-07-18 DIAGNOSIS — M8949 Other hypertrophic osteoarthropathy, multiple sites: Secondary | ICD-10-CM | POA: Diagnosis not present

## 2019-07-18 DIAGNOSIS — M353 Polymyalgia rheumatica: Secondary | ICD-10-CM | POA: Diagnosis not present

## 2019-07-18 DIAGNOSIS — Z125 Encounter for screening for malignant neoplasm of prostate: Secondary | ICD-10-CM | POA: Diagnosis not present

## 2019-07-18 DIAGNOSIS — M48062 Spinal stenosis, lumbar region with neurogenic claudication: Secondary | ICD-10-CM | POA: Diagnosis not present

## 2019-07-18 DIAGNOSIS — I10 Essential (primary) hypertension: Secondary | ICD-10-CM | POA: Diagnosis not present

## 2019-07-18 DIAGNOSIS — I480 Paroxysmal atrial fibrillation: Secondary | ICD-10-CM | POA: Diagnosis not present

## 2019-07-18 DIAGNOSIS — E7800 Pure hypercholesterolemia, unspecified: Secondary | ICD-10-CM | POA: Diagnosis not present

## 2019-07-25 DIAGNOSIS — I10 Essential (primary) hypertension: Secondary | ICD-10-CM | POA: Diagnosis not present

## 2019-07-25 DIAGNOSIS — L570 Actinic keratosis: Secondary | ICD-10-CM | POA: Diagnosis not present

## 2019-07-25 DIAGNOSIS — Z23 Encounter for immunization: Secondary | ICD-10-CM | POA: Diagnosis not present

## 2019-07-25 DIAGNOSIS — I480 Paroxysmal atrial fibrillation: Secondary | ICD-10-CM | POA: Diagnosis not present

## 2019-07-25 DIAGNOSIS — Z0000 Encounter for general adult medical examination without abnormal findings: Secondary | ICD-10-CM | POA: Diagnosis not present

## 2019-07-25 DIAGNOSIS — E785 Hyperlipidemia, unspecified: Secondary | ICD-10-CM | POA: Diagnosis not present

## 2019-07-25 DIAGNOSIS — M48061 Spinal stenosis, lumbar region without neurogenic claudication: Secondary | ICD-10-CM | POA: Diagnosis not present

## 2019-07-25 DIAGNOSIS — D649 Anemia, unspecified: Secondary | ICD-10-CM | POA: Diagnosis not present

## 2019-07-25 DIAGNOSIS — N400 Enlarged prostate without lower urinary tract symptoms: Secondary | ICD-10-CM | POA: Diagnosis not present

## 2019-08-01 DIAGNOSIS — D649 Anemia, unspecified: Secondary | ICD-10-CM | POA: Diagnosis not present

## 2019-11-17 DIAGNOSIS — M48062 Spinal stenosis, lumbar region with neurogenic claudication: Secondary | ICD-10-CM | POA: Diagnosis not present

## 2019-11-17 DIAGNOSIS — R600 Localized edema: Secondary | ICD-10-CM | POA: Diagnosis not present

## 2019-11-17 DIAGNOSIS — M5442 Lumbago with sciatica, left side: Secondary | ICD-10-CM | POA: Diagnosis not present

## 2019-11-17 DIAGNOSIS — M5441 Lumbago with sciatica, right side: Secondary | ICD-10-CM | POA: Diagnosis not present

## 2019-11-17 DIAGNOSIS — Z79899 Other long term (current) drug therapy: Secondary | ICD-10-CM | POA: Diagnosis not present

## 2019-11-17 DIAGNOSIS — I10 Essential (primary) hypertension: Secondary | ICD-10-CM | POA: Diagnosis not present

## 2019-11-17 DIAGNOSIS — D649 Anemia, unspecified: Secondary | ICD-10-CM | POA: Diagnosis not present

## 2019-11-17 DIAGNOSIS — M353 Polymyalgia rheumatica: Secondary | ICD-10-CM | POA: Diagnosis not present

## 2019-11-17 DIAGNOSIS — L03114 Cellulitis of left upper limb: Secondary | ICD-10-CM | POA: Diagnosis not present

## 2019-11-17 DIAGNOSIS — L03119 Cellulitis of unspecified part of limb: Secondary | ICD-10-CM | POA: Diagnosis not present

## 2019-11-24 DIAGNOSIS — L570 Actinic keratosis: Secondary | ICD-10-CM | POA: Diagnosis not present

## 2019-11-24 DIAGNOSIS — I10 Essential (primary) hypertension: Secondary | ICD-10-CM | POA: Diagnosis not present

## 2019-11-24 DIAGNOSIS — E785 Hyperlipidemia, unspecified: Secondary | ICD-10-CM | POA: Diagnosis not present

## 2019-11-24 DIAGNOSIS — N400 Enlarged prostate without lower urinary tract symptoms: Secondary | ICD-10-CM | POA: Diagnosis not present

## 2019-11-24 DIAGNOSIS — R609 Edema, unspecified: Secondary | ICD-10-CM | POA: Diagnosis not present

## 2019-11-24 DIAGNOSIS — M48061 Spinal stenosis, lumbar region without neurogenic claudication: Secondary | ICD-10-CM | POA: Diagnosis not present

## 2019-11-24 DIAGNOSIS — D649 Anemia, unspecified: Secondary | ICD-10-CM | POA: Diagnosis not present

## 2019-11-24 DIAGNOSIS — I480 Paroxysmal atrial fibrillation: Secondary | ICD-10-CM | POA: Diagnosis not present

## 2019-11-24 DIAGNOSIS — Z0000 Encounter for general adult medical examination without abnormal findings: Secondary | ICD-10-CM | POA: Diagnosis not present

## 2019-12-05 ENCOUNTER — Ambulatory Visit: Payer: Medicare HMO | Attending: Internal Medicine

## 2019-12-05 DIAGNOSIS — Z23 Encounter for immunization: Secondary | ICD-10-CM

## 2019-12-05 NOTE — Progress Notes (Signed)
   Covid-19 Vaccination Clinic  Name:  William Tapia    MRN: [id number] DOB: 1925-06-17  12/05/2019  William Tapia was observed post Covid-19 immunization for 15 minutes without incident. He was provided with Vaccine Information Sheet and instruction to access the V-Safe system.   William Tapia was instructed to call 911 with any severe reactions post vaccine: Marland Kitchen Difficulty breathing  . Swelling of face and throat  . A fast heartbeat  . A bad rash all over body  . Dizziness and weakness   Immunizations Administered    Name Date Dose VIS Date Route   Pfizer COVID-19 Vaccine 12/05/2019  3:49 PM 0.3 mL 08/26/2019 Intramuscular   Manufacturer: [place]   Lot: [id number]   [place]: [id number]

## 2019-12-26 ENCOUNTER — Ambulatory Visit: Payer: Medicare HMO | Attending: Internal Medicine

## 2019-12-26 DIAGNOSIS — Z23 Encounter for immunization: Secondary | ICD-10-CM

## 2019-12-26 NOTE — Progress Notes (Signed)
   Covid-19 Vaccination Clinic  Name:  DICKSON BARFIELD    MRN: [id number] DOB: April 03, 1925  12/26/2019  Mr. Rutt was observed post Covid-19 immunization for 15 minutes without incident. He was provided with Vaccine Information Sheet and instruction to access the V-Safe system.   Mr. Irey was instructed to call 911 with any severe reactions post vaccine: Marland Kitchen Difficulty breathing  . Swelling of face and throat  . A fast heartbeat  . A bad rash all over body  . Dizziness and weakness   Immunizations Administered    Name Date Dose VIS Date Route   Pfizer COVID-19 Vaccine 12/26/2019  3:22 PM 0.3 mL 08/26/2019 Intramuscular   Manufacturer: [place]   Lot: [phone] [phone]   [place]: [id number]

## 2019-12-29 DIAGNOSIS — Z961 Presence of intraocular lens: Secondary | ICD-10-CM | POA: Diagnosis not present

## 2020-03-20 DIAGNOSIS — M48062 Spinal stenosis, lumbar region with neurogenic claudication: Secondary | ICD-10-CM | POA: Diagnosis not present

## 2020-03-20 DIAGNOSIS — Z9181 History of falling: Secondary | ICD-10-CM | POA: Diagnosis not present

## 2020-03-20 DIAGNOSIS — M5441 Lumbago with sciatica, right side: Secondary | ICD-10-CM | POA: Diagnosis not present

## 2020-03-20 DIAGNOSIS — G8929 Other chronic pain: Secondary | ICD-10-CM | POA: Diagnosis not present

## 2020-03-20 DIAGNOSIS — R0602 Shortness of breath: Secondary | ICD-10-CM | POA: Diagnosis not present

## 2020-03-20 DIAGNOSIS — Z79899 Other long term (current) drug therapy: Secondary | ICD-10-CM | POA: Diagnosis not present

## 2020-03-20 DIAGNOSIS — I480 Paroxysmal atrial fibrillation: Secondary | ICD-10-CM | POA: Diagnosis not present

## 2020-03-20 DIAGNOSIS — I10 Essential (primary) hypertension: Secondary | ICD-10-CM | POA: Diagnosis not present

## 2020-03-20 DIAGNOSIS — Z23 Encounter for immunization: Secondary | ICD-10-CM | POA: Diagnosis not present

## 2020-03-28 DIAGNOSIS — D649 Anemia, unspecified: Secondary | ICD-10-CM | POA: Diagnosis not present

## 2020-07-23 DIAGNOSIS — M5441 Lumbago with sciatica, right side: Secondary | ICD-10-CM | POA: Diagnosis not present

## 2020-07-23 DIAGNOSIS — M8949 Other hypertrophic osteoarthropathy, multiple sites: Secondary | ICD-10-CM | POA: Diagnosis not present

## 2020-07-23 DIAGNOSIS — I10 Essential (primary) hypertension: Secondary | ICD-10-CM | POA: Diagnosis not present

## 2020-07-23 DIAGNOSIS — M48062 Spinal stenosis, lumbar region with neurogenic claudication: Secondary | ICD-10-CM | POA: Diagnosis not present

## 2020-07-23 DIAGNOSIS — G8929 Other chronic pain: Secondary | ICD-10-CM | POA: Diagnosis not present

## 2020-07-23 DIAGNOSIS — M5442 Lumbago with sciatica, left side: Secondary | ICD-10-CM | POA: Diagnosis not present

## 2020-07-23 DIAGNOSIS — R2689 Other abnormalities of gait and mobility: Secondary | ICD-10-CM | POA: Diagnosis not present

## 2020-07-23 DIAGNOSIS — I480 Paroxysmal atrial fibrillation: Secondary | ICD-10-CM | POA: Diagnosis not present

## 2020-07-23 DIAGNOSIS — Z9181 History of falling: Secondary | ICD-10-CM | POA: Diagnosis not present

## 2020-08-15 DEATH — deceased
